# Patient Record
Sex: Female | Born: 1955
Health system: Southern US, Community
[De-identification: ages and names within clinical notes are randomized; demographics above are authoritative.]

## PROBLEM LIST (undated history)

## (undated) DIAGNOSIS — M797 Fibromyalgia: Secondary | ICD-10-CM

## (undated) DIAGNOSIS — K5790 Diverticulosis of intestine, part unspecified, without perforation or abscess without bleeding: Secondary | ICD-10-CM

## (undated) DIAGNOSIS — E2740 Unspecified adrenocortical insufficiency: Secondary | ICD-10-CM

## (undated) DIAGNOSIS — M48061 Spinal stenosis, lumbar region without neurogenic claudication: Secondary | ICD-10-CM

## (undated) DIAGNOSIS — I2510 Atherosclerotic heart disease of native coronary artery without angina pectoris: Secondary | ICD-10-CM

## (undated) DIAGNOSIS — M4800 Spinal stenosis, site unspecified: Secondary | ICD-10-CM

## (undated) DIAGNOSIS — M459 Ankylosing spondylitis of unspecified sites in spine: Secondary | ICD-10-CM

## (undated) DIAGNOSIS — K8020 Calculus of gallbladder without cholecystitis without obstruction: Secondary | ICD-10-CM

## (undated) DIAGNOSIS — R413 Other amnesia: Secondary | ICD-10-CM

## (undated) DIAGNOSIS — E11319 Type 2 diabetes mellitus with unspecified diabetic retinopathy without macular edema: Secondary | ICD-10-CM

## (undated) DIAGNOSIS — M810 Age-related osteoporosis without current pathological fracture: Secondary | ICD-10-CM

## (undated) DIAGNOSIS — G4730 Sleep apnea, unspecified: Secondary | ICD-10-CM

## (undated) DIAGNOSIS — E559 Vitamin D deficiency, unspecified: Secondary | ICD-10-CM

## (undated) DIAGNOSIS — E119 Type 2 diabetes mellitus without complications: Secondary | ICD-10-CM

## (undated) DIAGNOSIS — K219 Gastro-esophageal reflux disease without esophagitis: Secondary | ICD-10-CM

## (undated) DIAGNOSIS — N200 Calculus of kidney: Secondary | ICD-10-CM

## (undated) DIAGNOSIS — I493 Ventricular premature depolarization: Secondary | ICD-10-CM

## (undated) DIAGNOSIS — E041 Nontoxic single thyroid nodule: Secondary | ICD-10-CM

## (undated) HISTORY — DX: Sleep apnea, unspecified: G47.30

## (undated) HISTORY — DX: Calculus of kidney: N20.0

## (undated) HISTORY — DX: Diverticulosis of intestine, part unspecified, without perforation or abscess without bleeding: K57.90

## (undated) HISTORY — DX: Other amnesia: R41.3

## (undated) HISTORY — PX: LITHOTRIPSY: SUR834

## (undated) HISTORY — DX: Vitamin D deficiency, unspecified: E55.9

## (undated) HISTORY — DX: Age-related osteoporosis without current pathological fracture: M81.0

## (undated) HISTORY — DX: Unspecified adrenocortical insufficiency: E27.40

## (undated) HISTORY — DX: Spinal stenosis, site unspecified: M48.00

## (undated) HISTORY — DX: Atherosclerotic heart disease of native coronary artery without angina pectoris: I25.10

## (undated) HISTORY — DX: Fibromyalgia: M79.7

## (undated) HISTORY — PX: TUBAL LIGATION: SHX77

## (undated) HISTORY — DX: Ankylosing spondylitis of unspecified sites in spine: M45.9

## (undated) HISTORY — DX: Type 2 diabetes mellitus without complications: E11.9

## (undated) HISTORY — DX: Calculus of gallbladder without cholecystitis without obstruction: K80.20

## (undated) HISTORY — DX: Type 2 diabetes mellitus with unspecified diabetic retinopathy without macular edema: E11.319

## (undated) HISTORY — DX: Nontoxic single thyroid nodule: E04.1

## (undated) HISTORY — DX: Gastro-esophageal reflux disease without esophagitis: K21.9

## (undated) HISTORY — PX: TONSILLECTOMY: SUR1361

## (undated) HISTORY — DX: Spinal stenosis, lumbar region without neurogenic claudication: M48.061

## (undated) HISTORY — DX: Ventricular premature depolarization: I49.3

---

## 2000-07-04 HISTORY — PX: ABDOMINAL HYSTERECTOMY: SHX81

## 2013-01-05 ENCOUNTER — Other Ambulatory Visit (HOSPITAL_COMMUNITY)
Admission: RE | Admit: 2013-01-05 | Discharge: 2013-01-05 | Disposition: A | Discharge status: Home / Self Care | Payer: BC Managed Care – PPO | Source: Ambulatory Visit | Attending: Obstetrics and Gynecology | Admitting: Obstetrics and Gynecology

## 2013-01-05 DIAGNOSIS — Z01419 Encounter for gynecological examination (general) (routine) without abnormal findings: Secondary | ICD-10-CM | POA: Insufficient documentation

## 2013-01-05 DIAGNOSIS — Z1151 Encounter for screening for human papillomavirus (HPV): Secondary | ICD-10-CM | POA: Insufficient documentation

## 2013-02-06 ENCOUNTER — Other Ambulatory Visit: Payer: Self-pay | Admitting: Family Medicine

## 2013-02-06 DIAGNOSIS — N281 Cyst of kidney, acquired: Secondary | ICD-10-CM

## 2013-02-15 ENCOUNTER — Inpatient Hospital Stay: Admission: RE | Admit: 2013-02-15 | Payer: BC Managed Care – PPO | Source: Ambulatory Visit

## 2013-06-30 ENCOUNTER — Encounter: Payer: Self-pay | Admitting: *Deleted

## 2013-06-30 ENCOUNTER — Encounter: Payer: Self-pay | Admitting: Cardiology

## 2013-07-09 ENCOUNTER — Ambulatory Visit: Payer: BC Managed Care – PPO | Admitting: Cardiology

## 2014-01-28 DIAGNOSIS — F1120 Opioid dependence, uncomplicated: Secondary | ICD-10-CM | POA: Insufficient documentation

## 2015-09-04 HISTORY — PX: COLON SURGERY: SHX602

## 2017-09-16 ENCOUNTER — Ambulatory Visit (INDEPENDENT_AMBULATORY_CARE_PROVIDER_SITE_OTHER): Payer: BLUE CROSS/BLUE SHIELD | Admitting: Family Medicine

## 2017-09-16 ENCOUNTER — Telehealth: Payer: Self-pay | Admitting: Gastroenterology

## 2017-09-16 ENCOUNTER — Encounter: Payer: Self-pay | Admitting: Family Medicine

## 2017-09-16 VITALS — BP 144/86 | HR 91 | Temp 98.7°F | Ht 62.0 in | Wt 167.4 lb

## 2017-09-16 DIAGNOSIS — M48061 Spinal stenosis, lumbar region without neurogenic claudication: Secondary | ICD-10-CM

## 2017-09-16 DIAGNOSIS — M797 Fibromyalgia: Secondary | ICD-10-CM | POA: Diagnosis not present

## 2017-09-16 DIAGNOSIS — E785 Hyperlipidemia, unspecified: Secondary | ICD-10-CM | POA: Insufficient documentation

## 2017-09-16 DIAGNOSIS — M1990 Unspecified osteoarthritis, unspecified site: Secondary | ICD-10-CM | POA: Insufficient documentation

## 2017-09-16 DIAGNOSIS — Z800 Family history of malignant neoplasm of digestive organs: Secondary | ICD-10-CM

## 2017-09-16 DIAGNOSIS — M150 Primary generalized (osteo)arthritis: Secondary | ICD-10-CM

## 2017-09-16 DIAGNOSIS — K5903 Drug induced constipation: Secondary | ICD-10-CM | POA: Diagnosis not present

## 2017-09-16 DIAGNOSIS — M159 Polyosteoarthritis, unspecified: Secondary | ICD-10-CM

## 2017-09-16 DIAGNOSIS — E271 Primary adrenocortical insufficiency: Secondary | ICD-10-CM

## 2017-09-16 DIAGNOSIS — E7849 Other hyperlipidemia: Secondary | ICD-10-CM

## 2017-09-16 DIAGNOSIS — IMO0002 Reserved for concepts with insufficient information to code with codable children: Secondary | ICD-10-CM | POA: Insufficient documentation

## 2017-09-16 DIAGNOSIS — M810 Age-related osteoporosis without current pathological fracture: Secondary | ICD-10-CM

## 2017-09-16 DIAGNOSIS — I2510 Atherosclerotic heart disease of native coronary artery without angina pectoris: Secondary | ICD-10-CM

## 2017-09-16 DIAGNOSIS — K8020 Calculus of gallbladder without cholecystitis without obstruction: Secondary | ICD-10-CM | POA: Insufficient documentation

## 2017-09-16 DIAGNOSIS — E118 Type 2 diabetes mellitus with unspecified complications: Secondary | ICD-10-CM

## 2017-09-16 DIAGNOSIS — E119 Type 2 diabetes mellitus without complications: Secondary | ICD-10-CM | POA: Diagnosis not present

## 2017-09-16 DIAGNOSIS — K629 Disease of anus and rectum, unspecified: Secondary | ICD-10-CM | POA: Diagnosis not present

## 2017-09-16 DIAGNOSIS — Z780 Asymptomatic menopausal state: Secondary | ICD-10-CM

## 2017-09-16 DIAGNOSIS — K760 Fatty (change of) liver, not elsewhere classified: Secondary | ICD-10-CM

## 2017-09-16 DIAGNOSIS — M459 Ankylosing spondylitis of unspecified sites in spine: Secondary | ICD-10-CM | POA: Diagnosis not present

## 2017-09-16 DIAGNOSIS — R413 Other amnesia: Secondary | ICD-10-CM

## 2017-09-16 DIAGNOSIS — K6289 Other specified diseases of anus and rectum: Secondary | ICD-10-CM | POA: Insufficient documentation

## 2017-09-16 DIAGNOSIS — K219 Gastro-esophageal reflux disease without esophagitis: Secondary | ICD-10-CM | POA: Insufficient documentation

## 2017-09-16 DIAGNOSIS — N200 Calculus of kidney: Secondary | ICD-10-CM | POA: Insufficient documentation

## 2017-09-16 DIAGNOSIS — M8580 Other specified disorders of bone density and structure, unspecified site: Secondary | ICD-10-CM

## 2017-09-16 DIAGNOSIS — R0789 Other chest pain: Secondary | ICD-10-CM | POA: Insufficient documentation

## 2017-09-16 DIAGNOSIS — G4733 Obstructive sleep apnea (adult) (pediatric): Secondary | ICD-10-CM | POA: Insufficient documentation

## 2017-09-16 DIAGNOSIS — T402X5A Adverse effect of other opioids, initial encounter: Secondary | ICD-10-CM

## 2017-09-16 DIAGNOSIS — E1165 Type 2 diabetes mellitus with hyperglycemia: Secondary | ICD-10-CM | POA: Insufficient documentation

## 2017-09-16 DIAGNOSIS — L438 Other lichen planus: Secondary | ICD-10-CM | POA: Insufficient documentation

## 2017-09-16 HISTORY — DX: Atherosclerotic heart disease of native coronary artery without angina pectoris: I25.10

## 2017-09-16 HISTORY — DX: Other specified disorders of bone density and structure, unspecified site: M85.80

## 2017-09-16 HISTORY — DX: Other amnesia: R41.3

## 2017-09-16 HISTORY — DX: Ankylosing spondylitis of unspecified sites in spine: M45.9

## 2017-09-16 HISTORY — DX: Spinal stenosis, lumbar region without neurogenic claudication: M48.061

## 2017-09-16 HISTORY — DX: Asymptomatic menopausal state: Z78.0

## 2017-09-16 LAB — CBC WITH DIFFERENTIAL/PLATELET
Basophils Absolute: 0.1 10*3/uL (ref 0.0–0.1)
Basophils Relative: 0.6 % (ref 0.0–3.0)
EOS ABS: 0.2 10*3/uL (ref 0.0–0.7)
Eosinophils Relative: 1.5 % (ref 0.0–5.0)
HCT: 44.2 % (ref 36.0–46.0)
HEMOGLOBIN: 14.4 g/dL (ref 12.0–15.0)
LYMPHS ABS: 3.2 10*3/uL (ref 0.7–4.0)
Lymphocytes Relative: 27.9 % (ref 12.0–46.0)
MCHC: 32.5 g/dL (ref 30.0–36.0)
MCV: 82.3 fl (ref 78.0–100.0)
MONO ABS: 0.7 10*3/uL (ref 0.1–1.0)
Monocytes Relative: 6.5 % (ref 3.0–12.0)
NEUTROS PCT: 63.5 % (ref 43.0–77.0)
Neutro Abs: 7.2 10*3/uL (ref 1.4–7.7)
Platelets: 274 10*3/uL (ref 150.0–400.0)
RBC: 5.37 Mil/uL — AB (ref 3.87–5.11)
RDW: 14.4 % (ref 11.5–15.5)
WBC: 11.3 10*3/uL — AB (ref 4.0–10.5)

## 2017-09-16 LAB — LIPID PANEL
CHOL/HDL RATIO: 5
CHOLESTEROL: 241 mg/dL — AB (ref 0–200)
HDL: 53.1 mg/dL (ref >39.00)
NONHDL: 187.68
Triglycerides: 270 mg/dL — ABNORMAL HIGH (ref 0.0–149.0)
VLDL: 54 mg/dL — ABNORMAL HIGH (ref 0.0–40.0)

## 2017-09-16 LAB — COMPREHENSIVE METABOLIC PANEL
ALBUMIN: 4.2 g/dL (ref 3.5–5.2)
ALT: 17 U/L (ref 0–35)
AST: 22 U/L (ref 0–37)
Alkaline Phosphatase: 74 U/L (ref 39–117)
BUN: 11 mg/dL (ref 6–23)
CHLORIDE: 102 meq/L (ref 96–112)
CO2: 29 meq/L (ref 19–32)
CREATININE: 0.76 mg/dL (ref 0.40–1.20)
Calcium: 9.5 mg/dL (ref 8.4–10.5)
GFR: 81.62 mL/min (ref >60.00)
GLUCOSE: 133 mg/dL — AB (ref 70–99)
POTASSIUM: 4.2 meq/L (ref 3.5–5.1)
SODIUM: 139 meq/L (ref 135–145)
Total Bilirubin: 0.5 mg/dL (ref 0.2–1.2)
Total Protein: 7.2 g/dL (ref 6.0–8.3)

## 2017-09-16 LAB — TSH: TSH: 4.65 u[IU]/mL — ABNORMAL HIGH (ref 0.35–4.50)

## 2017-09-16 LAB — LDL CHOLESTEROL, DIRECT: Direct LDL: 181 mg/dL

## 2017-09-16 LAB — HEMOGLOBIN A1C: HEMOGLOBIN A1C: 7.6 % — AB (ref 4.6–6.5)

## 2017-09-16 NOTE — Progress Notes (Signed)
Subjective  CC:  Chief Complaint  Patient presents with  . Establish Care    moved frrom  3 months ago. has multiple chronic medical problems. no records available for review  . Coronary Artery Disease  . Diabetes  . Hypertension  . Osteoarthritis    HPI: Christine Guzman is a 61 y.o. female who presents to [facility] at [facility] today to establish care with me as a new patient.  She has the following concerns or needs:   This is a 61 year old female who moved here from [facility] recently to be closer to her daughter and son who live in [place].  She has an extremely complicated past medical history.  I do not have medical records to review and have asked for them.  We discussed and documented the following:  Chronic pain due to fibromyalgia, ankylosing spondylitis, spinal stenosis, fibromyalgia requiring chronic opioid use.  She has chronic opioid-induced constipation.  She needs a referral to a pain specialist to manage this here.  She reports that she has been seen by rheumatology and spine specialist in the past.  She is chronic pain and an unsteady gait.  She walks with a cane.  History of diabetes, hypertension, strong family history of coronary artery disease, CAD herself, hyperlipidemia.  She is a vague historian.  Reports her diabetes was most recently managed with metformin although she had been on multiple medications in the past.  Her last A1c was over 1 year ago.  She is not currently on medications for diabetes.  She denies symptoms of hyperglycemia.  Family history is complicated as well, multiple family members with coronary artery disease, death of a daughter at age 81.  Multiple cancers, diabetes, hyperlipidemia.  Family history of colon cancer status post multiple colonoscopies.  She reports that she had a rectal mass removed from her rectum in 2016.  She cannot tell me what the diagnosis was however it seems that it was benign.  She was  recommended to have a follow-up colonoscopy but has not had this yet.  She has had multiple colonoscopies in the past due to her family history.  She has chronic constipation now but no longer with rectal pain or melena.  No weight loss or chills.  Please refer to problem list below: We discussed each problem in detail.    We updated and reviewed the patient's past history in detail and it is documented below.  Patient Active Problem List   Diagnosis Date Noted  . Spinal stenosis, lumbar 09/16/2017  . Ankylosing spondylitis (HCC) 09/16/2017  . Osteoarthritis 09/16/2017  . Erosive lichen planus of vulva 09/16/2017  . Rectal mass 09/16/2017  . Therapeutic opioid induced constipation 09/16/2017  . Osteopenia after menopause 09/16/2017  . Memory impairment 09/16/2017  . Diabetes mellitus without complication (HCC) 09/16/2017  . Fibromyalgia 09/16/2017  . GERD (gastroesophageal reflux disease) 09/16/2017  . Nephrolithiasis 09/16/2017  . Fatty liver 09/16/2017  . Familial hyperlipidemia 09/16/2017  . Gallstone 09/16/2017  . OSA (obstructive sleep apnea) 09/16/2017  . CAD (coronary artery disease), native coronary artery 09/16/2017  . Chronic narcotic dependence (HCC) 01/28/2014     Health Maintenance  Topic Date Due  . HEMOGLOBIN A1C  [birth year]  . Hepatitis C Screening  03-Mar-1956  . PNEUMOCOCCAL POLYSACCHARIDE VACCINE (1) 06/09/1956  . FOOT EXAM  06/09/1964  . OPHTHALMOLOGY EXAM  06/09/1964  . URINE MICROALBUMIN  06/09/1964  . HIV Screening  06/09/1969  . TETANUS/TDAP  06/09/1973  . MAMMOGRAM  06/09/2004  . COLONOSCOPY  06/09/2004  . INFLUENZA VACCINE  09/16/2018 (Originally 05/04/2017)    There is no immunization history on file for this patient. Current Meds  Medication Sig  . aspirin 81 MG tablet Take 81 mg by mouth daily.  . clobetasol cream (TEMOVATE) 0.05 % Apply 1 application topically 2 (two) times daily.  Marland Kitchen. esomeprazole (NEXIUM) 40 MG capsule Take 40 mg by mouth  daily before breakfast.  . estradiol (ESTRACE) 0.1 MG/GM vaginal cream Place vaginally.  . magnesium citrate ([facility] CITRATE OF MAGNESIA) SOLN Take by mouth.  . metFORMIN (GLUCOPHAGE) 500 MG tablet Take 500 mg by mouth 2 (two) times daily with a meal.  . morphine (KADIAN) 60 MG 24 hr capsule TAKE ONE CAPSULE BY MOUTH EVERY 12 HOURS FOR SEVERE SPIINE PAIN  . Vitamin D, Ergocalciferol, (DRISDOL) 50000 UNITS CAPS capsule Take 50,000 Units by mouth 2 (two) times a week.   . [DISCONTINUED] methylnaltrexone (RELISTOR) 12 MG/0.6ML SOLN injection Inject every other day    Allergies: Patient is allergic to amoxicillin and sulfa antibiotics. Past Medical History Patient  has a past medical history of Adrenal insufficiency (HCC), Ankylosing spondylitis (HCC) (09/16/2017), CAD (coronary artery disease), native coronary artery (09/16/2017), Diabetes (HCC), Diverticulosis, Fibromyalgia, Gallstone, GERD (gastroesophageal reflux disease), Kidney stone, Memory impairment (09/16/2017), Osteopenia after menopause (09/16/2017), PVC (premature ventricular contraction), Sleep apnea, Spinal stenosis, Spinal stenosis, lumbar (09/16/2017), Thyroid nodule, and Vitamin D deficiency. Past Surgical History Patient  has a past surgical history that includes Tonsillectomy; Lithotripsy; Tubal ligation; Abdominal hysterectomy (07/2000); Cesarean section; and Colon surgery (09/2015). Family History: Patient family history includes Cirrhosis in her son; Diabetes in her daughter and mother; Heart disease in her brother and daughter; Hypertension in her son; Liver cancer in her mother; Lung cancer in her father; Nephrolithiasis in her daughter; Polycystic ovary syndrome in her daughter and daughter; Stroke in her brother and son. Social History:  Patient  reports that  has never smoked. she has never used smokeless tobacco. She reports that she does not drink alcohol or use drugs.  Review of Systems: Constitutional: negative for fever  or malaise Ophthalmic: negative for photophobia, double vision or loss of vision Cardiovascular: negative for chest pain, dyspnea on exertion, or new LE swelling Respiratory: negative for SOB or persistent cough, positive history of recurrent pneumonia, collapsed lung on the right. Gastrointestinal: negative for abdominal pain, change in bowel habits or melena Genitourinary: negative for dysuria or gross hematuria Musculoskeletal: negative for new gait disturbance or muscular weakness, positive for chronic gait instability Integumentary: negative for new or persistent rashes Neurological: negative for TIA or stroke symptoms, reports history of early dementia Psychiatric: negative for SI or delusions Allergic/Immunologic: negative for hives  Patient Care Team    Relationship Specialty Notifications Start End  Willow OraAndy, Averil Digman L, MD PCP - General Family Medicine  09/16/17     Objective  Vitals: BP (!) 144/86 (BP Location: Left Arm, Patient Position: Sitting, Cuff Size: Normal)   Pulse 91   Temp 98.7 F (37.1 C) (Oral)   Ht 5\' 2"  (1.575 m)   Wt 167 lb 6.1 oz (75.9 kg)   SpO2 95%   BMI 30.61 kg/m  General:  Well developed, well nourished, no acute distress  Psych:  Alert and oriented,normal mood and affect HEENT:  Normocephalic, atraumatic, non-icteric sclera, PERRL, oropharynx is without mass or exudate, supple neck without adenopathy, mass or thyromegaly Cardiovascular:  RRR without gallop, rub or murmur, nondisplaced PMI Respiratory:  Good breath sounds bilaterally,  CTAB with normal respiratory effort Gastrointestinal: normal bowel sounds, soft, non-tender MSK: no deformities, contusions. Joints are without erythema or swelling Skin:  Warm, no rashes or suspicious lesions noted Neurologic:    Mental status is normal. Gross motor and sensory exams are normal.  Assessment  1. Spinal stenosis of lumbar region without neurogenic claudication   2. Ankylosing spondylitis, unspecified  site of spine (HCC)   3. Primary osteoarthritis involving multiple joints   4. Rectal mass   5. Therapeutic opioid induced constipation   6. Diabetes mellitus without complication (HCC)   7. Fibromyalgia   8. Fatty liver   9. Familial hyperlipidemia   10. Coronary artery disease involving native coronary artery of native heart without angina pectoris   11. Family history of colon cancer      Plan   Complicated case: Today we will start by ordering lab work to assess diabetic control, renal function, hyperlipidemia.  Will make recommendations on medication management once results are back.  Today blood pressure remains mildly elevated.  Request records to get a better understanding of her past GI history and diagnosis for rectal mass.  Referred to gastroenterology for follow-up colonoscopy.  Chronic pain referral to manage chronic narcotic dependence.  Request records from cardiology to better understand her heart disease.  Referral to cardiology made today.  Close follow-up at monthly intervals until chronic problems are well controlled.  Today's visit was 45 minutes long. Greater than 50% of this time was devoted to face to face counseling with the patient and coordination of care. We discussed her diagnosis, prognosis, treatment options and developed a follow up plan as documented above.    Follow up:  Return in about 4 weeks (around 10/14/2017) for follow up on medical problems, 30 minutes.  Commons side effects, risks, benefits, and alternatives for medications and treatment plan prescribed today were discussed, and the patient expressed understanding of the given instructions. Patient is instructed to call or message via MyChart if he/she has any questions or concerns regarding our treatment plan. No barriers to understanding were identified. We discussed Red Flag symptoms and signs in detail. Patient expressed understanding regarding what to do in case of urgent or emergency type  symptoms.   Medication list was reconciled, printed and provided to the patient in AVS. Patient instructions and summary information was reviewed with the patient as documented in the AVS. This note was prepared with assistance of Dragon voice recognition software. Occasional wrong-word or sound-a-like substitutions may have occurred due to the inherent limitations of voice recognition software  Orders Placed This Encounter  Procedures  . CBC with Differential/Platelet  . Comprehensive metabolic panel  . Lipid panel  . Hemoglobin A1c  . TSH  . Hepatitis C antibody  . Ambulatory referral to Gastroenterology  . Ambulatory referral to Pain Clinic  . Ambulatory referral to Cardiology   No orders of the defined types were placed in this encounter.

## 2017-09-16 NOTE — Patient Instructions (Signed)
Please return in 4 weeks for a follow up appointment. 30 minutes please.  It was a pleasure meeting you! Thank you for choosing us to meet your healthcare needs! I truly look forward to working with you.  I will release your lab results to you on your MyChart account with further instructions. Please reply with any questions.   We will call you with information regarding your referral appointment. Pain management, Gastroenterology and Cardiology.

## 2017-09-16 NOTE — Telephone Encounter (Signed)
Okay to schedule as directed by Dr Lavon PaganiniNandigam. Thank you

## 2017-09-16 NOTE — Telephone Encounter (Signed)
Ok to schedule with APP and colonoscopy with any provider that has availability for a procedure soon. Thanks

## 2017-09-16 NOTE — Telephone Encounter (Signed)
There are not any GI records. But she has a verbal history of a rectal mass. Okay to schedule her with an APP. How soon does she need to be seen.

## 2017-09-16 NOTE — Telephone Encounter (Signed)
No answer, mailbox full. Will try again later.

## 2017-09-17 LAB — HEPATITIS C ANTIBODY
Hepatitis C Ab: NONREACTIVE
SIGNAL TO CUT-OFF: 0.09 (ref <1.00)

## 2017-09-19 ENCOUNTER — Other Ambulatory Visit: Payer: Self-pay | Admitting: *Deleted

## 2017-09-19 ENCOUNTER — Encounter: Payer: Self-pay | Admitting: Family Medicine

## 2017-09-19 ENCOUNTER — Telehealth: Payer: Self-pay | Admitting: *Deleted

## 2017-09-19 ENCOUNTER — Encounter: Payer: Self-pay | Admitting: Physician Assistant

## 2017-09-19 MED ORDER — ESOMEPRAZOLE MAGNESIUM 40 MG PO CPDR: 40.0000 mg | DELAYED_RELEASE_CAPSULE | Freq: Every day | ORAL | 1 refills | Status: DC

## 2017-09-19 MED ORDER — CLOBETASOL PROPIONATE 0.05 % EX CREA: 1.0000 "application " | TOPICAL_CREAM | Freq: Two times a day (BID) | CUTANEOUS | 0 refills | Status: DC

## 2017-09-19 MED ORDER — ESTRADIOL 0.1 MG/GM VA CREA: 1.0000 | TOPICAL_CREAM | VAGINAL | 1 refills | Status: DC

## 2017-09-19 MED ORDER — METFORMIN HCL 500 MG PO TABS: 500.0000 mg | ORAL_TABLET | Freq: Two times a day (BID) | ORAL | 1 refills | Status: DC

## 2017-09-19 NOTE — Telephone Encounter (Signed)
Dr. Mardelle MatteAndy, while speaking with pt about lab results she said she needs refill on Hydrocortisone 5 mg, 2 in AM and 2 in PM for Adrenal disease. Is not on medication list.

## 2017-09-20 ENCOUNTER — Other Ambulatory Visit (INDEPENDENT_AMBULATORY_CARE_PROVIDER_SITE_OTHER): Payer: BLUE CROSS/BLUE SHIELD

## 2017-09-20 DIAGNOSIS — E7849 Other hyperlipidemia: Secondary | ICD-10-CM | POA: Diagnosis not present

## 2017-09-20 LAB — T4, FREE: Free T4: 0.78 ng/dL (ref 0.60–1.60)

## 2017-09-20 MED ORDER — HYDROCORTISONE 5 MG PO TABS: ORAL_TABLET | ORAL | 3 refills | Status: DC

## 2017-09-20 MED ORDER — METFORMIN HCL ER 500 MG PO TB24: 1000.0000 mg | ORAL_TABLET | Freq: Every day | ORAL | 3 refills | Status: DC

## 2017-09-20 NOTE — Addendum Note (Signed)
Addended by: Asencion PartridgeANDY, CAMILLE on: 09/20/2017 03:52 PM   Modules accepted: Orders

## 2017-09-22 ENCOUNTER — Telehealth: Payer: Self-pay | Admitting: *Deleted

## 2017-09-22 MED ORDER — ONETOUCH VERIO W/DEVICE KIT: 1.0000 | PACK | Freq: Two times a day (BID) | 0 refills | Status: AC | PRN

## 2017-09-22 MED ORDER — GLUCOSE BLOOD VI STRP: ORAL_STRIP | 12 refills | Status: DC

## 2017-09-22 MED ORDER — ONETOUCH DELICA LANCETS 33G MISC: 2 refills | Status: AC

## 2017-09-22 NOTE — Telephone Encounter (Signed)
Spoke to Christine Guzman, told her Dr. Mardelle MatteAndy wants her to check sugars twice a day, once in the AM before breakfast and in the pm. Christine Guzman verbalized understanding. Asked Christine Guzman if she knew what monitor is covered by insurance? Christine Guzman said no, but her ENDO said One Touch. Told Christine Guzman okay I will order One Touch meter and test strips and send to pharmacy. Christine Guzman verbalized understanding.

## 2017-09-23 ENCOUNTER — Encounter: Payer: Self-pay | Admitting: Physician Assistant

## 2017-09-23 ENCOUNTER — Ambulatory Visit (INDEPENDENT_AMBULATORY_CARE_PROVIDER_SITE_OTHER): Payer: BLUE CROSS/BLUE SHIELD | Admitting: Physician Assistant

## 2017-09-23 VITALS — BP 118/78 | HR 71 | Ht 62.0 in | Wt 164.0 lb

## 2017-09-23 DIAGNOSIS — K5909 Other constipation: Secondary | ICD-10-CM

## 2017-09-23 DIAGNOSIS — K219 Gastro-esophageal reflux disease without esophagitis: Secondary | ICD-10-CM | POA: Diagnosis not present

## 2017-09-23 DIAGNOSIS — Z1211 Encounter for screening for malignant neoplasm of colon: Secondary | ICD-10-CM

## 2017-09-23 DIAGNOSIS — Z800 Family history of malignant neoplasm of digestive organs: Secondary | ICD-10-CM | POA: Diagnosis not present

## 2017-09-23 DIAGNOSIS — Z8719 Personal history of other diseases of the digestive system: Secondary | ICD-10-CM

## 2017-09-23 MED ORDER — ESOMEPRAZOLE MAGNESIUM 40 MG PO CPDR
40.0000 mg | DELAYED_RELEASE_CAPSULE | Freq: Two times a day (BID) | ORAL | 3 refills | Status: DC
Start: 2017-09-23 — End: 2018-06-12

## 2017-09-23 NOTE — Progress Notes (Signed)
Agree with assessment and plan as outlined.  

## 2017-09-23 NOTE — Progress Notes (Signed)
Subjective:    Patient ID: Christine Guzman, female    DOB: [birth year], 61 y.o.   MRN: [id number]  HPI Christine Guzman is a 61 year old white female, new to GI today referred by Dr. Billey Chang for consideration of colonoscopy and review of her complicated GI history.  Patient has just relocated to [place].  She had previously been living in [facility] [place] [facility] and then in the [facility] area.  She has history of coronary artery disease, sleep apnea, opioid-induced constipation.  She is on chronic morphine for chronic back pain, ankylosing spondylitis, and spinal stenosis.  Also with fibromyalgia, adult onset diabetes mellitus and fatty liver disease. She says she had a gastroenterologist in [place] that she saw for about 20 years.  She has had prior endoscopy and colonoscopy there. She has a nephew who apparently was diagnosed with colon cancer at age 44, no other family members with colon cancer that she is aware of. She had a lot of difficulty with anal /rectal pain in 2016.  She says she has a very hard time getting a diagnosis that was having a hard time sitting.  And having a very hard time having bowel movements.  She was originally seen a Psychologist, sport and exercise affiliated with [facility].  I can see that note in her records, and anoscopy was done without a definite lesion seen, she was noted to have internal hemorrhoids and some friability and was to be referred for colonoscopy. She eventually was evaluated in [facility] had a CT scan done which showed an 8 x 8 cm calcified mass at the rectosigmoid junction.  We did obtain copies of those records.  Flexible sigmoidoscopy was done on 09/07/2015 which showed an obstructing fecal mass in the proximal rectum approximately 10 cm from the dentate line.  A significant amount of time was spent trying to dislodge this with the no success. She did have an exam under anesthesia by a surgeon and had manual removal of the large fecal impaction. Patient says she has been tried  on several medications for chronic constipation without success and is now using 10 ounces of mag citrate daily which works very well.  She says she has bowel movements daily without any straining or constipation.  She has not been having any recent bleeding. Her last colonoscopy was done in 2013 apparently had a lipoma present in the proximal transverse colon and noted diverticulosis, there were no polyps.  She also has history of chronic GERD.  She has been on twice daily Nexium long-term.  She says she has tried to cut back to once daily with immediate exacerbation of symptoms.  She has no current complaints of dysphagia or odynophagia and the Nexium is controlling her symptoms.  She has had prior EGD but does not remember the date.  Review of Systems;Pertinent positive and negative review of systems were noted in the above HPI section.  All other review of systems was otherwise negative.  Outpatient Encounter Medications as of 09/23/2017  Medication Sig  . aspirin 81 MG tablet Take 81 mg by mouth daily.  . Blood Glucose Monitoring Suppl (ONETOUCH VERIO) w/Device KIT 1 kit by Does not apply route 2 (two) times daily as needed.  . clobetasol cream (TEMOVATE) 8.65 % Apply 1 application topically 2 (two) times daily.  Marland Kitchen esomeprazole (NEXIUM) 40 MG capsule Take 1 capsule (40 mg total) by mouth 2 (two) times daily before a meal.  . estradiol (ESTRACE) 0.1 MG/GM vaginal cream Place 1 Applicatorful vaginally 3 (  three) times a week.  Marland Kitchen glucose blood (ONETOUCH VERIO) test strip USE TO CHECK BLOOD SUGARS TWICE A DAY AND PRN  . hydrocortisone (CORTEF) 5 MG tablet Take 2 tablets (10 mg total) by mouth every morning AND 1 tablet (5 mg total) at bedtime.  . magnesium citrate ([facility]S CITRATE OF MAGNESIA) SOLN Take by mouth.  . metFORMIN (GLUCOPHAGE-XR) 500 MG 24 hr tablet Take 2 tablets (1,000 mg total) by mouth daily with breakfast.  . morphine (KADIAN) 60 MG 24 hr capsule TAKE ONE CAPSULE BY MOUTH EVERY 12  HOURS FOR SEVERE SPIINE PAIN  . ONETOUCH DELICA LANCETS 94B MISC USE TO CHECK BLOOD SUGAR TWICE A DAY AND PRN  . Vitamin D, Ergocalciferol, (DRISDOL) 50000 UNITS CAPS capsule Take 50,000 Units by mouth 2 (two) times a week.   . [DISCONTINUED] esomeprazole (NEXIUM) 40 MG capsule Take 1 capsule (40 mg total) by mouth daily before breakfast.   No facility-administered encounter medications on file as of 09/23/2017.    Allergies  Allergen Reactions  . Amoxicillin Rash  . Sulfa Antibiotics Rash   Patient Active Problem List   Diagnosis Date Noted  . Spinal stenosis, lumbar 09/16/2017  . Ankylosing spondylitis ([place]) 09/16/2017  . Osteoarthritis 09/16/2017  . Erosive lichen planus of vulva 09/16/2017  . Rectal mass 09/16/2017  . Therapeutic opioid induced constipation 09/16/2017  . Osteopenia after menopause 09/16/2017  . Memory impairment 09/16/2017  . Diabetes mellitus without complication ([place]) 09/62/8366  . Fibromyalgia 09/16/2017  . GERD (gastroesophageal reflux disease) 09/16/2017  . Nephrolithiasis 09/16/2017  . Fatty liver 09/16/2017  . Familial hyperlipidemia 09/16/2017  . Gallstone 09/16/2017  . OSA (obstructive sleep apnea) 09/16/2017  . CAD (coronary artery disease), native coronary artery 09/16/2017  . Chronic narcotic dependence ([place]) 01/28/2014   Social History   Socioeconomic History  . Marital status: Married    Spouse name: Delilah Shan  . Number of children: 4  . Years of education: Not on file  . Highest education level: Not on file  Social Needs  . Financial resource strain: Hard  . Food insecurity - worry: Not on file  . Food insecurity - inability: Not on file  . Transportation needs - medical: Not on file  . Transportation needs - non-medical: Not on file  Occupational History  . Occupation: disabled, pain  Tobacco Use  . Smoking status: Never Smoker  . Smokeless tobacco: Never Used  Substance and Sexual Activity  . Alcohol use: No    Frequency: Never   . Drug use: No  . Sexual activity: No  Other Topics Concern  . Not on file  Social History Narrative  . Not on file    Christine Guzman's family history includes Cirrhosis in her son; Diabetes in her daughter and mother; Heart disease in her brother and daughter; Hypertension in her son; Liver cancer in her mother; Lung cancer in her father; Nephrolithiasis in her daughter; Polycystic ovary syndrome in her daughter and daughter; Stroke in her brother and son.      Objective:    Vitals:   09/23/17 1336  BP: 118/78  Pulse: 71    Physical Exam well-developed older white female in no acute distress, blood pressure 118/78, pulse 71, height 5 foot 2, weight 164, BMI of 30.  HEENT; nontraumatic normocephalic EOMI PERRLA sclera anicteric, Cardiovascular ;regular rate and rhythm with S1-S2 no murmur rub or gallop, Pulmonary ;clear bilaterally, Abdomen ;soft, basically nontender bowel sounds are present she has a low midline incisional scar, no  palpable mass or hepatosplenomegaly, Rectal ;exam not done, Extremities ;no clubbing cyanosis or edema skin warm and dry, Neuro psych; mood and affect appropriate       Assessment & Plan:   #50 61 year old white female with chronic severe constipation lifelong by her report but now narcotic dependent on chronic morphine.  She had a severe fecal impaction in 2016 which required surgical intervention for removal.  She has been doing well on 10 ounces of mag citrate daily for management of her constipation.  She states she tried Amitiza and Linzess in the past without any benefit. #2 family history of colon cancer in a maternal nephew #3 diverticulosis  #4.  Chronic back pain, narcotic dependent, history of final stenosis and ankylosing spondylitis #5.  Fibromyalgia #6.  Chronic GERD #7.  Fatty liver #8.  Adult onset diabetes mellitus #9.  Coronary artery disease #10 history of adrenal insufficiency  Plan; patient will continue mag citrate 10 ounces  daily Patient will be scheduled for colonoscopy with Dr. Havery Moros.  She was advised to have colonoscopy in 2016 which was never done. Procedure was discussed in detail with patient including indications risks and benefits and she is agreeable to proceed. We will give her a prolonged bowel prep.  She is asked to take a whole bottle of mag citrate on the day prior to the colonoscopy and prior to starting regular bowel prep. Will increase her chronic steroids around the time of sedation.  She will double up on her dosage for 24 hours prior to colonoscopy.  Prescription was sent for Nexium 40 mg p.o. twice daily, #180 with 3 refills Patient signed a release and we will obtain her prior records from gastroenterologist in [place].   Amy S Esterwood PA-C 09/23/2017   Cc: Leamon Arnt, MD

## 2017-09-23 NOTE — Patient Instructions (Addendum)
You have been scheduled for a colonoscopy. Please follow written instructions given to you at your visit today.  Please pick up your prep supplies at the pharmacy within the next 1-3 days. [facility] [address]  If you use inhalers (even only as needed), please bring them with you on the day of your procedure.   Take 20 mg Cortef in the morning, day before colonoscopy. Take 10 mg the night before the colonoscopy and 20 mg the morning of the procedure.  IIf you are age 63 or younger, your body mass index should be between 19-25. Your Body mass index is 30 kg/m. If this is out of the aformentioned range listed, please consider follow up with your Primary Care Provider.

## 2017-09-26 ENCOUNTER — Encounter: Payer: Self-pay | Admitting: Family Medicine

## 2017-09-26 DIAGNOSIS — E559 Vitamin D deficiency, unspecified: Secondary | ICD-10-CM | POA: Insufficient documentation

## 2017-09-26 DIAGNOSIS — E11319 Type 2 diabetes mellitus with unspecified diabetic retinopathy without macular edema: Secondary | ICD-10-CM

## 2017-09-26 DIAGNOSIS — E042 Nontoxic multinodular goiter: Secondary | ICD-10-CM | POA: Insufficient documentation

## 2017-09-26 HISTORY — DX: Type 2 diabetes mellitus with unspecified diabetic retinopathy without macular edema: E11.319

## 2017-10-10 ENCOUNTER — Telehealth: Payer: Self-pay | Admitting: Gastroenterology

## 2017-10-10 ENCOUNTER — Ambulatory Visit (INDEPENDENT_AMBULATORY_CARE_PROVIDER_SITE_OTHER): Payer: BLUE CROSS/BLUE SHIELD | Admitting: Family Medicine

## 2017-10-10 ENCOUNTER — Encounter: Payer: Self-pay | Admitting: Family Medicine

## 2017-10-10 VITALS — BP 120/70 | HR 70 | Temp 98.1°F | Ht 62.0 in | Wt 166.0 lb

## 2017-10-10 DIAGNOSIS — E7849 Other hyperlipidemia: Secondary | ICD-10-CM | POA: Diagnosis not present

## 2017-10-10 DIAGNOSIS — L304 Erythema intertrigo: Secondary | ICD-10-CM | POA: Diagnosis not present

## 2017-10-10 DIAGNOSIS — K6289 Other specified diseases of anus and rectum: Secondary | ICD-10-CM

## 2017-10-10 DIAGNOSIS — K629 Disease of anus and rectum, unspecified: Secondary | ICD-10-CM | POA: Diagnosis not present

## 2017-10-10 DIAGNOSIS — E271 Primary adrenocortical insufficiency: Secondary | ICD-10-CM | POA: Diagnosis not present

## 2017-10-10 DIAGNOSIS — K5903 Drug induced constipation: Secondary | ICD-10-CM | POA: Diagnosis not present

## 2017-10-10 DIAGNOSIS — IMO0002 Reserved for concepts with insufficient information to code with codable children: Secondary | ICD-10-CM

## 2017-10-10 DIAGNOSIS — Z23 Encounter for immunization: Secondary | ICD-10-CM

## 2017-10-10 DIAGNOSIS — Z1231 Encounter for screening mammogram for malignant neoplasm of breast: Secondary | ICD-10-CM | POA: Diagnosis not present

## 2017-10-10 DIAGNOSIS — E559 Vitamin D deficiency, unspecified: Secondary | ICD-10-CM

## 2017-10-10 DIAGNOSIS — E118 Type 2 diabetes mellitus with unspecified complications: Secondary | ICD-10-CM | POA: Diagnosis not present

## 2017-10-10 DIAGNOSIS — T402X5A Adverse effect of other opioids, initial encounter: Secondary | ICD-10-CM | POA: Diagnosis not present

## 2017-10-10 DIAGNOSIS — E1165 Type 2 diabetes mellitus with hyperglycemia: Secondary | ICD-10-CM

## 2017-10-10 DIAGNOSIS — E042 Nontoxic multinodular goiter: Secondary | ICD-10-CM | POA: Diagnosis not present

## 2017-10-10 DIAGNOSIS — Z1239 Encounter for other screening for malignant neoplasm of breast: Secondary | ICD-10-CM

## 2017-10-10 LAB — VITAMIN D 25 HYDROXY (VIT D DEFICIENCY, FRACTURES): VITD: 82.68 ng/mL (ref 30.00–100.00)

## 2017-10-10 LAB — MICROALBUMIN / CREATININE URINE RATIO
CREATININE, U: 182.1 mg/dL
MICROALB UR: 1.1 mg/dL (ref 0.0–1.9)
Microalb Creat Ratio: 0.6 mg/g (ref 0.0–30.0)

## 2017-10-10 MED ORDER — ROSUVASTATIN CALCIUM 20 MG PO TABS: 20.0000 mg | ORAL_TABLET | Freq: Every day | ORAL | 3 refills | Status: DC

## 2017-10-10 MED ORDER — KETOCONAZOLE 2 % EX CREA: 1.0000 "application " | TOPICAL_CREAM | Freq: Every day | CUTANEOUS | 0 refills | Status: DC

## 2017-10-10 MED ORDER — NYSTATIN 100000 UNIT/GM EX POWD: Freq: Two times a day (BID) | CUTANEOUS | 0 refills | Status: DC

## 2017-10-10 NOTE — Patient Instructions (Signed)
Please return in 6-8 weeks to recheck your diabetes. Please call to make an appointment for your eye exam: [facility]  If you have any questions or concerns, please don't hesitate to send me a message via MyChart or call the office at [phone]. Thank you for visiting with [place] today! It's our pleasure caring for you.   Type 2 Diabetes Mellitus, Self Care, Adult When you have type 2 diabetes (type 2 diabetes mellitus), you must keep your blood sugar (glucose) under control. You can do this with:  Nutrition.  Exercise.  Lifestyle changes.  Medicines or insulin, if needed.  Support from your doctors and others.  How do I manage my blood sugar?  Check your blood sugar level every day, as often as told.  Call your doctor if your blood sugar is above your goal numbers for 2 tests in a row.  Have your A1c (hemoglobin A1c) level checked at least two times a year. Have it checked more often if your doctor tells you to. Your doctor will set treatment goals for you. Generally, you should have these blood sugar levels:  Before meals (preprandial): 80-130 mg/dL (4.4-7.2 mmol/L).  After meals (postprandial): lower than 180 mg/dL (10 mmol/L).  A1c level: less than 7%.  What do I need to know about high blood sugar? High blood sugar is called hyperglycemia. Know the signs of high blood sugar. Signs may include:  Feeling: ? Thirsty. ? Hungry. ? Very tired.  Needing to pee (urinate) more than usual.  Blurry vision.  What do I need to know about low blood sugar? Low blood sugar is called hypoglycemia. This is when blood sugar is at or below 70 mg/dL (3.9 mmol/L). Symptoms may include:  Feeling: ? Hungry. ? Worried or nervous (anxious). ? Sweaty and clammy. ? Confused. ? Dizzy. ? Sleepy. ? Sick to your stomach (nauseous).  Having: ? A fast heartbeat (palpitations). ? A headache. ? A change in your vision. ? Jerky movements that you cannot control  (seizure). ? Nightmares. ? Tingling or no feeling (numbness) around the mouth, lips, or tongue.  Having trouble with: ? Talking. ? Paying attention (concentrating). ? Moving (coordination). ? Sleeping.  Shaking.  Passing out (fainting).  Getting upset easily (irritability).  Treating low blood sugar  To treat low blood sugar, eat or drink something sugary right away. If you can think clearly and swallow safely, follow the 15:15 rule:  Take 15 grams of a fast-acting carb (carbohydrate). Some fast-acting carbs are: ? 1 tube of glucose gel. ? 3 sugar tablets (glucose pills). ? 6-8 pieces of hard candy. ? 4 oz (120 mL) of fruit juice. ? 4 oz (120 mL) regular (not diet) soda.  Check your blood sugar 15 minutes after you take the carb.  If your blood sugar is still at or below 70 mg/dL (3.9 mmol/L), take 15 grams of a carb again.  If your blood sugar does not go above 70 mg/dL (3.9 mmol/L) after 3 tries, get help right away.  After your blood sugar goes back to normal, eat a meal or a snack within 1 hour.  Treating very low blood sugar If your blood sugar is at or below 54 mg/dL (3 mmol/L), you have very low blood sugar (severe hypoglycemia). This is an emergency. Do not wait to see if the symptoms will go away. Get medical help right away. Call your local emergency services (911 in the [place] Do not drive yourself to the hospital. If you have  very low blood sugar and you cannot eat or drink, you may need a glucagon shot (injection). A family member or friend should learn how to check your blood sugar and how to give you a glucagon shot. Ask your doctor if you need to have a glucagon shot kit at home. What else is important to manage my diabetes? Medicine Follow these instructions about insulin and diabetes medicines:  Take them as told by your doctor.  Adjust them as told by your doctor.  Do not run out of them.  Having diabetes can raise your risk for other long-term  conditions. These include heart or kidney disease. Your doctor may prescribe medicines to help prevent problems from diabetes. Food   Make healthy food choices. These include: ? Chicken, fish, egg whites, and beans. ? Oats, whole wheat, bulgur, brown rice, quinoa, and millet. ? Fresh fruits and vegetables. ? Low-fat dairy products. ? Nuts, avocado, olive oil, and canola oil.  Make a food plan with a specialist (dietitian).  Follow instructions from your doctor about what you cannot eat or drink.  Drink enough fluid to keep your pee (urine) clear or pale yellow.  Eat healthy snacks between healthy meals.  Keep track of carbs that you eat. Read food labels. Learn food serving sizes.  Follow your sick day plan when you cannot eat or drink normally. Make this plan with your doctor so it is ready to use. Activity  Exercise at least 3 times a week.  Do not go more than 2 days without exercising.  Talk with your doctor before you start a new exercise. Your doctor may need to adjust your insulin, medicines, or food. Lifestyle   Do not use any tobacco products. These include cigarettes, chewing tobacco, and e-cigarettes.If you need help quitting, ask your doctor.  Ask your doctor how much alcohol is safe for you.  Learn to deal with stress. If you need help with this, ask your doctor. Body care  Stay up to date with your shots (immunizations).  Have your eyes and feet checked by a doctor as often as told.  Check your skin and feet every day. Check for cuts, bruises, redness, blisters, or sores.  Brush your teeth and gums two times a day.  Floss at least one time a day.  Go to the dentist least one time every 6 months.  Stay at a healthy weight. General instructions   Take over-the-counter and prescription medicines only as told by your doctor.  Share your diabetes care plan with: ? Your work or school. ? People you live with.  Check your pee (urine) for  ketones: ? When you are sick. ? As told by your doctor.  Carry a card or wear jewelry that says that you have diabetes.  Ask your doctor: ? Do I need to meet with a diabetes educator? ? Where can I find a support group for people with diabetes?  Keep all follow-up visits as told by your doctor. This is important. Where to find more information: To learn more about diabetes, visit:  American Diabetes Association: www.diabetes.org  American Association of Diabetes Educators: www.diabeteseducator.org/patient-resources  This information is not intended to replace advice given to you by your health care provider. Make sure you discuss any questions you have with your health care provider. Document Released: 01/12/2016 Document Revised: 02/26/2016 Document Reviewed: 10/24/2015 Elsevier Interactive Patient Education  [employer].

## 2017-10-10 NOTE — Telephone Encounter (Signed)
Records received from prior workup:  Colonoscopy 12/09/2011 - lipoma in proximal transverse colon, diverticulosis of left colon, normal ileum and remainder of examined colon. Prep was reported as good.   Will await her pending procedure scheduled with us.

## 2017-10-10 NOTE — Progress Notes (Signed)
Subjective  CC:  Chief Complaint  Patient presents with  . Follow-up    HPI: Christine Guzman is a 62 y.o. female who presents to the office today for follow up of diabetes and problems listed above in the chief complaint. Established care here last month: brought back today to review her PL after receiving medical records and she has had her GI appt.  Diabetes follow up: Her diabetic control is reported as Worse. It is currently uncontrolled due to being out of metformin x 8 months. Now back on it x 3-4 weeks and tolerating well. sxs of hyperglycemia are improving.  She denies exertional CP or SOB or symptomatic hypoglycemia. She denies foot sores or paresthesias. She is due for her eye exam.  She does have history of diabetic retinopathy.  Complains of red rash underneath her abdominal skin folds.  She has had nystatin cream in the past for this.  No fevers or drainage.  Chronic constipation-I reviewed her notes from gastroenterology.  Records from GI doctors in the past and surgeon in the past revealed that her rectal mass was in fact a chronic fecal impaction that required surgical removal.  She has had no tumors or cancers.  She is scheduled for colonoscopy.  History of Addison's disease-needs referral to endocrinology.  She is back on her hydrocortisone.  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  Chronic opioid dependence and chronic back pain-awaiting pain management referral.  Cardiology records have been reviewed-I have not seen any documentation of cardiac or coronary artery disease.  We discussed this.  We will cancel audiology referral  Familial hyperlipidemia no longer on statins.  She has been on statins in the past but is unclear about how she did on them.   There is no immunization history on file for this patient.  Diabetes Related Lab Review: Lab Results  Component Value Date   HGBA1C 7.6 (H) 09/16/2017   No results found for:  [facility] Lab Results  Component Value Date   CREATININE 0.76 09/16/2017   BUN 11 09/16/2017   NA 139 09/16/2017   K 4.2 09/16/2017   CL 102 09/16/2017   CO2 29 09/16/2017   Lab Results  Component Value Date   CHOL 241 (H) 09/16/2017   Lab Results  Component Value Date   HDL 53.10 09/16/2017   No results found for: [facility] Lab Results  Component Value Date   TRIG 270.0 (H) 09/16/2017   Lab Results  Component Value Date   CHOLHDL 5 09/16/2017   Lab Results  Component Value Date   LDLDIRECT 181.0 09/16/2017   The 10-year ASCVD risk score Denman George DC Jr., et al., 2013) is: 8.6%   Values used to calculate the score:     Age: 57 years     Sex: Female     Is Non-Hispanic African American: No     Diabetic: Yes     Tobacco smoker: No     Systolic Blood Pressure: 120 mmHg     Is BP treated: No     HDL Cholesterol: 53.1 mg/dL     Total Cholesterol: 241 mg/dL I have reviewed the PMH, Fam and Soc history. Patient Active Problem List   Diagnosis Date Noted  . Diabetic retinopathy associated with type 2 diabetes mellitus (HCC) 09/26/2017    Priority: High  . Spinal stenosis, lumbar 09/16/2017    Priority: High  . Ankylosing spondylitis (HCC) 09/16/2017    Priority: High  .  Therapeutic opioid induced constipation 09/16/2017    Priority: High  . Diabetes mellitus type 2, uncontrolled, with complications (HCC) 09/16/2017    Priority: High  . Fibromyalgia 09/16/2017    Priority: High  . Familial hyperlipidemia 09/16/2017    Priority: High  . OSA (obstructive sleep apnea) 09/16/2017    Priority: High  . Chronic narcotic dependence (HCC) 01/28/2014    Priority: High  . Nontoxic multinodular goiter 09/26/2017    Priority: Medium  . Osteoarthritis 09/16/2017    Priority: Medium  . Osteopenia after menopause 09/16/2017    Priority: Medium  . Memory impairment 09/16/2017    Priority: Medium  . GERD (gastroesophageal reflux disease) 09/16/2017    Priority:  Medium  . Fatty liver 09/16/2017    Priority: Medium  . Erosive lichen planus of vulva 09/16/2017    Priority: Low  . Rectal mass - chronic fecal impaction 09/16/2017    Priority: Low  . Nephrolithiasis 09/16/2017    Priority: Low  . Gallstone 09/16/2017    Priority: Low  . Chest pain, non-cardiac 09/16/2017    Priority: Low  . Vitamin D deficiency 09/26/2017    Social History: Patient  reports that  has never smoked. she has never used smokeless tobacco. She reports that she does not drink alcohol or use drugs.  Review of Systems: Ophthalmic: negative for eye pain, loss of vision or double vision Cardiovascular: negative for chest pain Respiratory: negative for SOB or persistent cough Gastrointestinal: negative for abdominal pain Genitourinary: negative for dysuria or gross hematuria MSK: negative for foot lesions Neurologic: negative for weakness or gait disturbance  Objective  Vitals: BP 120/70 (BP Location: Left Arm, Patient Position: Sitting, Cuff Size: Normal)   Pulse 70   Temp 98.1 F (36.7 C) (Oral)   Ht 5\' 2"  (1.575 m)   Wt 166 lb (75.3 kg)   SpO2 96%   BMI 30.36 kg/m  General: well appearing, no acute distress  Psych:  Alert and oriented, normal mood and affect HEENT:  Normocephalic, atraumatic, moist mucous membranes, supple neck  Cardiovascular:  Nl S1 and S2, RRR with systolic murmur, gallop or rub. no edema Respiratory:  Good breath sounds bilaterally, CTAB with normal effort, no rales Gastrointestinal: normal BS, soft, nontender Skin:  Warm, no rashes Neurologic:   Mental status is normal. normal gait Foot exam: no erythema, pallor, or cyanosis visible nl proprioception and sensation to monofilament testing bilaterally, +2 distal pulses bilaterally    Assessment  1. Diabetes mellitus type 2, uncontrolled, with complications (HCC)   2. Familial hyperlipidemia   3. Therapeutic opioid induced constipation   4. Vitamin D deficiency   5. Nontoxic  multinodular goiter   6. Adrenal insufficiency (Addison's disease) (HCC)   7. Breast cancer screening   8. Intertrigo   9. Rectal mass - chronic fecal impaction      Plan   Diabetes is currently marginally controlled.  Continue metformin and improve diet and we will recheck an A1c in about 6-8 weeks.  Additional medications at that time if not at goal.  Patient to schedule an eye exam.  Today updated her pneumococcal vaccination.  Diabetic education given in the after visit summary.  Check urine for microalbuminuria.  Blood pressure is well controlled and she is not on an ACE inhibitor at this time.  Treat intertrigo with ketoconazole and then nystatin powder as needed.  Hyperlipidemia: Recommend starting Crestor 20 mg nightly and rechecking in 6-12 weeks.  Noncardiac chest pain, cancel cardiology referral  History of vitamin D deficiency on high-dose supplementation for years-check levels.  Patient to discuss with GI she is concerned about malabsorption.  I have warned about vitamin D toxicity.  She can also discuss with endocrinology  History of nontoxic multinodular goiter with normal free T4 levels.  Continue to monitor exam is stable.  Adrenal insufficiency: Refer to endocrinology for management, chronic.  Health maintenance: Tdap updated today, referral made for mammogram. Diabetic education: ongoing education regarding chronic disease management for diabetes was given today. We continue to reinforce the ABC's of diabetic management: A1c (<7 or 8 dependent upon patient), tight blood pressure control, and cholesterol management with goal LDL < 100 minimally. We discuss diet strategies, exercise recommendations, medication options and possible side effects. At each visit, we review recommended immunizations and preventive care recommendations for diabetics and stress that good diabetic control can prevent other problems. See below for this patient's data.  Follow up: Return in about 6  weeks (around 11/21/2017) for follow up of diabetes and hypertension..   Commons side effects, risks, benefits, and alternatives for medications and treatment plan prescribed today were discussed, and the patient expressed understanding of the given instructions. Patient is instructed to call or message via MyChart if he/she has any questions or concerns regarding our treatment plan. No barriers to understanding were identified. We discussed Red Flag symptoms and signs in detail. Patient expressed understanding regarding what to do in case of urgent or emergency type symptoms.   Medication list was reconciled, printed and provided to the patient in AVS. Patient instructions and summary information was reviewed with the patient as documented in the AVS. This note was prepared with assistance of Dragon voice recognition software. Occasional wrong-word or sound-a-like substitutions may have occurred due to the inherent limitations of voice recognition software  Orders Placed This Encounter  Procedures  . MM DIGITAL SCREENING BILATERAL  . VITAMIN D 25 Hydroxy (Vit-D Deficiency, Fractures)  . Microalbumin / creatinine urine ratio  . Ambulatory referral to Endocrinology   Meds ordered this encounter  Medications  . rosuvastatin (CRESTOR) 20 MG tablet    Sig: Take 1 tablet (20 mg total) by mouth at bedtime.    Dispense:  90 tablet    Refill:  3  . nystatin (MYCOSTATIN/NYSTOP) powder    Sig: Apply topically 2 (two) times daily.    Dispense:  45 g    Refill:  0  . ketoconazole (NIZORAL) 2 % cream    Sig: Apply 1 application topically daily.    Dispense:  15 g    Refill:  0

## 2017-10-10 NOTE — Addendum Note (Signed)
Addended by: Jimmye NormanPHANOS, Caedyn Tassinari J on: 10/10/2017 09:07 AM   Modules accepted: Orders

## 2017-10-12 ENCOUNTER — Encounter: Payer: Self-pay | Admitting: Family Medicine

## 2017-10-13 NOTE — Telephone Encounter (Signed)
Please call to check on status of pain mgt referral and then give feedback to patient.   Also, please cancel cardiology referral.  Thank you!

## 2017-10-13 NOTE — Telephone Encounter (Signed)
Please fill out handicap placard app for patient.  Permanent, due to pain restricting walking  Then notify pt when ready for pick up.   Thank you

## 2017-10-14 ENCOUNTER — Encounter: Payer: Self-pay | Admitting: Family Medicine

## 2017-10-14 ENCOUNTER — Telehealth: Payer: Self-pay | Admitting: Family Medicine

## 2017-10-14 NOTE — Telephone Encounter (Signed)
Copied from CRM [phone]. Topic: General - Other >> Oct 14, 2017 10:36 AM Gerrianne ScalePayne, Neal Trulson L wrote: ,Reason for CRM: patient calling stating that she had did exactly what Marylene Landngela told her to do, to go to [facility] to fill out release of medical records and they had told the patient that was not the proper procedure there they wouldn't allow patient to fill out any paper work

## 2017-10-14 NOTE — Telephone Encounter (Signed)
Did you need me to call [facility]?

## 2017-10-14 NOTE — Telephone Encounter (Signed)
[facility] since they are requesting this before they will schedule an appt.

## 2017-10-23 ENCOUNTER — Other Ambulatory Visit: Payer: Self-pay | Admitting: Family Medicine

## 2017-10-31 ENCOUNTER — Encounter: Payer: Self-pay | Admitting: Cardiology

## 2017-10-31 ENCOUNTER — Encounter: Payer: Self-pay | Admitting: Family Medicine

## 2017-10-31 ENCOUNTER — Ambulatory Visit: Payer: BLUE CROSS/BLUE SHIELD | Admitting: Cardiology

## 2017-10-31 VITALS — BP 142/82 | HR 62 | Ht 61.0 in | Wt 167.0 lb

## 2017-10-31 DIAGNOSIS — E7801 Familial hypercholesterolemia: Secondary | ICD-10-CM | POA: Diagnosis not present

## 2017-10-31 DIAGNOSIS — R0609 Other forms of dyspnea: Secondary | ICD-10-CM | POA: Diagnosis not present

## 2017-10-31 DIAGNOSIS — E119 Type 2 diabetes mellitus without complications: Secondary | ICD-10-CM | POA: Diagnosis not present

## 2017-10-31 NOTE — Progress Notes (Signed)
Cardiology Office Note   Date:  10/31/2017   ID:  Christine Guzman, DOB [birth year], MRN [id number]  PCP:  Leamon Arnt, MD  Cardiologist:   France Lusty Martinique, MD   Chief Complaint  Patient presents with  . Follow-up    NP.  Christine Guzman Headache  . Shortness of Breath      History of Present Illness: Christine Guzman is a 62 y.o. female who is seen at the request of Dr. Jonni Sanger for evaluation of dyspnea. She has a history of familial hypercholesterolemia and DM type 2. She has a very strong family history of CAD. She reports she has been followed by cardiology for over 20 years and will typically get a stress test every other year. Her last evaluation was in 2017 with Dr. Arbutus Leas in [place], [place]. She had a normal Myoview study then. Echo showed normal LV function with mild AI and TR. Normal pulmonary pressures.  She reports chronic SOB on exertion for 15-20 years. Sometimes it is associated with chest pain. She was on metoprolol for years but stopped taking it when she moved. Thinks it may have helped a little. No documented history of CAD/CV disease. She reports her father was a Film/video editor at [employer].    Past Medical History:  Diagnosis Date  . Adrenal insufficiency ([place])   . Ankylosing spondylitis ([place]) 09/16/2017  . CAD (coronary artery disease), native coronary artery 09/16/2017   LVH, nuclear stress test, no MI or cardiac cath  . Diabetes ([place])   . Diabetic retinopathy associated with type 2 diabetes mellitus ([place]) 09/26/2017  . Diverticulosis   . Fibromyalgia   . Gallstone   . GERD (gastroesophageal reflux disease)   . Kidney stone   . Memory impairment 09/16/2017   Neurologist in [facility]  . Osteopenia after menopause 09/16/2017  . PVC (premature ventricular contraction)    mild MR and trivial AR/TR on echo 2009  . Sleep apnea   . Spinal stenosis    disc disease in the C-spine, T-spine, L-spine, nerve damage (pain management  . Spinal stenosis, lumbar 09/16/2017   Chronic pain mgt  . Thyroid nodule   . Vitamin D deficiency     Past Surgical History:  Procedure Laterality Date  . ABDOMINAL HYSTERECTOMY  07/2000  . CESAREAN SECTION     x 4  . COLON SURGERY  09/2015   [facility] [place]  . LITHOTRIPSY    . TONSILLECTOMY    . TUBAL LIGATION       Current Outpatient Medications  Medication Sig Dispense Refill  . aspirin 81 MG tablet Take 81 mg by mouth daily.    . Blood Glucose Monitoring Suppl (ONETOUCH VERIO) w/Device KIT 1 kit by Does not apply route 2 (two) times daily as needed. 1 kit 0  . clobetasol cream (TEMOVATE) 3.33 % Apply 1 application topically 2 (two) times daily. 30 g 0  . esomeprazole (NEXIUM) 40 MG capsule Take 1 capsule (40 mg total) by mouth 2 (two) times daily before a meal. 180 capsule 3  . estradiol (ESTRACE) 0.1 MG/GM vaginal cream Place 1 Applicatorful vaginally 3 (three) times a week. 42.5 g 1  . hydrocortisone (CORTEF) 5 MG tablet Take 2 tablets (10 mg total) by mouth every morning AND 1 tablet (5 mg total) at bedtime. 270 tablet 3  . ketoconazole (NIZORAL) 2 % cream Apply 1 application topically daily. 15 g 0  . magnesium citrate ([employer]S CITRATE OF MAGNESIA) SOLN Take by  mouth.    . metFORMIN (GLUCOPHAGE-XR) 500 MG 24 hr tablet Take 2 tablets (1,000 mg total) by mouth daily with breakfast. 90 tablet 3  . morphine (KADIAN) 60 MG 24 hr capsule TAKE ONE CAPSULE BY MOUTH EVERY 12 HOURS FOR SEVERE SPIINE PAIN  0  . nystatin (MYCOSTATIN/NYSTOP) powder Apply topically 2 (two) times daily. 45 g 0  . ONETOUCH DELICA LANCETS 20U MISC USE TO CHECK BLOOD SUGAR TWICE A DAY AND PRN 200 each 2  . ONETOUCH VERIO test strip USE TO CHECK BLOOD SUGARS TWICE A DAY AND AS NEEDED **DX CODE E11.9 100 each 3  . rosuvastatin (CRESTOR) 20 MG tablet Take 1 tablet (20 mg total) by mouth at bedtime. 90 tablet 3  . Vitamin D, Ergocalciferol, (DRISDOL) 50000 UNITS CAPS capsule Take 50,000 Units by mouth 2 (two) times a week.        No current facility-administered medications for this visit.     Allergies:   Amoxicillin and Sulfa antibiotics    Social History:  The patient  reports that  has never smoked. she has never used smokeless tobacco. She reports that she does not drink alcohol or use drugs.   Family History:  The patient's family history includes Cirrhosis in her son; Diabetes in her daughter and mother; Heart disease in her brother and daughter; Hypertension in her son; Liver cancer in her mother; Lung cancer in her father; Nephrolithiasis in her daughter; Polycystic ovary syndrome in her daughter and daughter; Stroke in her brother and son.  She reports she is one of 12 children and all her siblings have CV disease. Her daughter died age 58 with complications of PCOD. Son age 64 just had CABG.    ROS:  Please see the history of present illness.   Otherwise, review of systems are positive for.   All other systems are reviewed and negative.    PHYSICAL EXAM: VS:  BP (!) 142/82 Comment: right arm.  Pulse 62   Ht '5\' 1"'$  (1.549 m)   Wt 167 lb (75.8 kg)   BMI 31.55 kg/m  , BMI Body mass index is 31.55 kg/m. GEN: Well nourished, well developed, in no acute distress  HEENT: normal  Neck: no JVD, carotid bruits, or masses Cardiac: RRR; no murmurs, rubs, or gallops,no edema  Respiratory:  clear to auscultation bilaterally, normal work of breathing GI: soft, nontender, nondistended, + BS MS: no deformity or atrophy  Skin: warm and dry, no rash Neuro:  Strength and sensation are intact Psych: euthymic mood, full affect   EKG:  EKG is ordered today. The ekg ordered today demonstrates NSR with LAD and LVH by voltage. I have personally reviewed and interpreted this study.    Recent Labs: 09/16/2017: ALT 17; BUN 11; Creatinine, Ser 0.76; Hemoglobin 14.4; Platelets 274.0; Potassium 4.2; Sodium 139; TSH 4.65    Lipid Panel    Component Value Date/Time   CHOL 241 (H) 09/16/2017 1212   TRIG 270.0 (H)  09/16/2017 1212   HDL 53.10 09/16/2017 1212   CHOLHDL 5 09/16/2017 1212   VLDL 54.0 (H) 09/16/2017 1212   LDLDIRECT 181.0 09/16/2017 1212      Wt Readings from Last 3 Encounters:  10/31/17 167 lb (75.8 kg)  10/10/17 166 lb (75.3 kg)  09/23/17 164 lb (74.4 kg)      Other studies Reviewed: Additional studies/ records that were reviewed today include: Echo and Myoview study from 2017. Review of the above records demonstrates: see HPI   ASSESSMENT  AND PLAN:  1.  Chronic dyspnea on exertion and chest pressure. Prior cardiac evaluation low risk and symptoms really have not changed for 15 years. She does have multiple CV risk factors and she is intermediate risk for CV disease in next 10 yrs. At this point would focus more on risk factor modification. If symptoms of dyspnea or chest pain change then I will be happy to see back and reassess. Encourage regular aerobic exercise.  2. Hyperlipidemia. Mixed. Above labs were on no therapy. Agree with high dose statin therapy with Crestor. If lipids still not at goal I would have a low threshold for adding a PCSK 9 inhibitor like Repatha. Goal LDL 70. 3. DM type 2. Currently on metformin. Likewise if A1c not at goal on metformin I would consider adding Jardiance given data about lowering CV risk.    Current medicines are reviewed at length with the patient today.  The patient does not have concerns regarding medicines.  The following changes have been made:  no change  Labs/ tests ordered today include:  No orders of the defined types were placed in this encounter.    Disposition:   FU with me prn   Signed, Emitt Maglione Martinique, MD  10/31/2017 2:56 PM    [facility] [address] [place], [place], [address] Phone [phone], Fax [phone]

## 2017-10-31 NOTE — Patient Instructions (Signed)
Continue your current therapy  If your lipids are not at goal on Crestor I would consider addition of Repatha  If your diabetes mellitus is not controlled on metformin I would consider Jardiance.  I will be happy to see you if your symptoms of chest pain or dyspnea get worse.

## 2017-11-08 ENCOUNTER — Encounter: Payer: Self-pay | Admitting: Gastroenterology

## 2017-11-08 ENCOUNTER — Ambulatory Visit: Payer: BLUE CROSS/BLUE SHIELD | Admitting: Cardiology

## 2017-11-09 ENCOUNTER — Encounter: Payer: Self-pay | Admitting: Gastroenterology

## 2017-11-11 ENCOUNTER — Other Ambulatory Visit: Payer: Self-pay

## 2017-11-11 ENCOUNTER — Encounter: Payer: Self-pay | Admitting: Family Medicine

## 2017-11-11 ENCOUNTER — Ambulatory Visit: Payer: BLUE CROSS/BLUE SHIELD | Admitting: Family Medicine

## 2017-11-11 VITALS — BP 122/78 | HR 84 | Temp 98.7°F | Ht 61.0 in | Wt 164.6 lb

## 2017-11-11 DIAGNOSIS — J0100 Acute maxillary sinusitis, unspecified: Secondary | ICD-10-CM

## 2017-11-11 DIAGNOSIS — J029 Acute pharyngitis, unspecified: Secondary | ICD-10-CM | POA: Diagnosis not present

## 2017-11-11 LAB — POCT RAPID STREP A (OFFICE): Rapid Strep A Screen: NEGATIVE

## 2017-11-11 MED ORDER — PREDNISONE 20 MG PO TABS: ORAL_TABLET | ORAL | 0 refills | Status: DC

## 2017-11-11 MED ORDER — GUAIFENESIN-CODEINE 100-10 MG/5ML PO SOLN: 5.0000 mL | Freq: Four times a day (QID) | ORAL | 0 refills | Status: DC | PRN

## 2017-11-11 MED ORDER — LEVOFLOXACIN 500 MG PO TABS: 500.0000 mg | ORAL_TABLET | Freq: Every day | ORAL | 0 refills | Status: DC

## 2017-11-11 NOTE — Addendum Note (Signed)
Addended byDene Gentry: PETERMAN, AMY M on: 11/11/2017 02:27 PM   Modules accepted: Orders

## 2017-11-11 NOTE — Progress Notes (Signed)
Subjective   CC:  Chief Complaint  Patient presents with  . Sore Throat    Symptoms for the past 2-3 weeks   . Nasal Congestion  . Facial Pain  . Cough  . Fever    HPI: Christine Guzman is a 62 y.o. female who presents to the office today to address the problems listed above in the chief complaint.  Patient reports sinus congestion and pressure with thick drainage, mild nonproductive cough, ear pressure without pain, and mild malaise.  Symptoms have been present for several days.Shedenies high fevers, GI symptoms, shortness of breath she also has a swollen painful throat x 2 weeks, worse over last several days. Niece with strep. Shehas had sinus infections in the past and this feels similar.  Patient is not a non-smoker.  No history of asthma or COPD.  I reviewed the patients updated PMH, FH, and SocHx.    Patient Active Problem List   Diagnosis Date Noted  . Diabetic retinopathy associated with type 2 diabetes mellitus ([place]) 09/26/2017    Priority: High  . Spinal stenosis, lumbar 09/16/2017    Priority: High  . Ankylosing spondylitis ([place]) 09/16/2017    Priority: High  . Therapeutic opioid induced constipation 09/16/2017    Priority: High  . Diabetes mellitus type 2, uncontrolled, with complications ([place]) 62/37/6283    Priority: High  . Fibromyalgia 09/16/2017    Priority: High  . Familial hyperlipidemia 09/16/2017    Priority: High  . OSA (obstructive sleep apnea) 09/16/2017    Priority: High  . Chronic narcotic dependence ([place]) 01/28/2014    Priority: High  . Nontoxic multinodular goiter 09/26/2017    Priority: Medium  . Osteoarthritis 09/16/2017    Priority: Medium  . Osteopenia after menopause 09/16/2017    Priority: Medium  . Memory impairment 09/16/2017    Priority: Medium  . GERD (gastroesophageal reflux disease) 09/16/2017    Priority: Medium  . Fatty liver 09/16/2017    Priority: Medium  . Erosive lichen planus of vulva 09/16/2017    Priority: Low  .  Rectal mass - chronic fecal impaction 09/16/2017    Priority: Low  . Nephrolithiasis 09/16/2017    Priority: Low  . Gallstone 09/16/2017    Priority: Low  . Chest pain, non-cardiac 09/16/2017    Priority: Low  . Vitamin D deficiency 09/26/2017   Current Meds  Medication Sig  . aspirin 81 MG tablet Take 81 mg by mouth daily.  . Blood Glucose Monitoring Suppl (ONETOUCH VERIO) w/Device KIT 1 kit by Does not apply route 2 (two) times daily as needed.  . clobetasol cream (TEMOVATE) 1.51 % Apply 1 application topically 2 (two) times daily.  Marland Kitchen esomeprazole (NEXIUM) 40 MG capsule Take 1 capsule (40 mg total) by mouth 2 (two) times daily before a meal.  . estradiol (ESTRACE) 0.1 MG/GM vaginal cream Place 1 Applicatorful vaginally 3 (three) times a week.  . hydrocortisone (CORTEF) 5 MG tablet Take 2 tablets (10 mg total) by mouth every morning AND 1 tablet (5 mg total) at bedtime.  Marland Kitchen ketoconazole (NIZORAL) 2 % cream Apply 1 application topically daily.  . magnesium citrate (CVS CITRATE OF MAGNESIA) SOLN Take by mouth.  . metFORMIN (GLUCOPHAGE-XR) 500 MG 24 hr tablet Take 2 tablets (1,000 mg total) by mouth daily with breakfast.  . morphine (KADIAN) 60 MG 24 hr capsule TAKE ONE CAPSULE BY MOUTH EVERY 12 HOURS FOR SEVERE SPIINE PAIN  . nystatin (MYCOSTATIN/NYSTOP) powder Apply topically 2 (two) times daily.  Marland Kitchen  ONETOUCH DELICA LANCETS 16R MISC USE TO CHECK BLOOD SUGAR TWICE A DAY AND PRN  . ONETOUCH VERIO test strip USE TO CHECK BLOOD SUGARS TWICE A DAY AND AS NEEDED **DX CODE E11.9  . rosuvastatin (CRESTOR) 20 MG tablet Take 1 tablet (20 mg total) by mouth at bedtime.  . Vitamin D, Ergocalciferol, (DRISDOL) 50000 UNITS CAPS capsule Take 50,000 Units by mouth 2 (two) times a week.     Review of Systems: Cardiovascular: negative for chest pain Respiratory: negative for SOB or persistent cough Gastrointestinal: negative for abdominal pain Genitourinary: negative for dysuria or gross  hematuria  Objective  Vitals: BP 122/78   Pulse 84   Temp 98.7 F (37.1 C)   Ht _0  (1.549 m)   Wt 164 lb 9.6 oz (74.7 kg)   SpO2 98%   BMI 31.10 kg/m  General: no acute distress  Psych:  Alert and oriented, normal mood and affect HEENT:  Normocephalic, atraumatic, TMs with serous effusions or retraction w/o erythema, nasal mucosa is red with purulent drainage, tender maxillary sinus present, OP swollen and erythematous w/o eudate, midline uvula, supple neck with LAD Cardiovascular:  RRR without murmur or gallop. no peripheral edema Respiratory:  Good breath sounds bilaterally, CTAB with normal respiratory effort Skin:  Warm, no rashes Neurologic:   Mental status is normal. normal gait Rapid strep test: negativve  Assessment  1. Acute non-recurrent maxillary sinusitis   2. Pharyngitis, unspecified etiology      Plan    Sinusitis: History and exam is most consistent with bacterial sinus infection.  Etiology and prognosis discussed with patient.  Recommend antibiotics as ordered below.  Patient to complete course of antibiotics, use supportive medications like mucolytics and decongestants as needed.  May use Tylenol or Advil if needed.  Symptoms should improve over the next 2 weeks.  Patient will return or call if symptoms persist or worsen. Pt requests levaquin.  Pharyngitis: pred burst; monitor sugars.  Follow up: Return for as scheduled.    Commons side effects, risks, benefits, and alternatives for medications and treatment plan prescribed today were discussed, and the patient expressed understanding of the given instructions. Patient is instructed to call or message via MyChart if he/she has any questions or concerns regarding our treatment plan. No barriers to understanding were identified. We discussed Red Flag symptoms and signs in detail. Patient expressed understanding regarding what to do in case of urgent or emergency type symptoms.   Medication list was reconciled,  printed and provided to the patient in AVS. Patient instructions and summary information was reviewed with the patient as documented in the AVS. This note was prepared with assistance of Dragon voice recognition software. Occasional wrong-word or sound-a-like substitutions may have occurred due to the inherent limitations of voice recognition software  No orders of the defined types were placed in this encounter.  Meds ordered this encounter  Medications  . levofloxacin (LEVAQUIN) 500 MG tablet    Sig: Take 1 tablet (500 mg total) by mouth daily.    Dispense:  14 tablet    Refill:  0  . predniSONE (DELTASONE) 20 MG tablet    Sig: Take 2 tabs daily for 5 days    Dispense:  10 tablet    Refill:  0  . guaiFENesin-codeine 100-10 MG/5ML syrup    Sig: Take 5 mLs by mouth every 6 (six) hours as needed for cough.    Dispense:  120 mL    Refill:  0

## 2017-11-14 ENCOUNTER — Encounter: Payer: Self-pay | Admitting: Family Medicine

## 2017-11-15 MED ORDER — CEFDINIR 300 MG PO CAPS: 300.0000 mg | ORAL_CAPSULE | Freq: Two times a day (BID) | ORAL | 0 refills | Status: DC

## 2017-11-22 ENCOUNTER — Encounter: Payer: BLUE CROSS/BLUE SHIELD | Admitting: Gastroenterology

## 2017-12-09 ENCOUNTER — Encounter: Payer: Self-pay | Admitting: Endocrinology

## 2017-12-31 ENCOUNTER — Encounter: Payer: Self-pay | Admitting: Family Medicine

## 2018-01-02 ENCOUNTER — Other Ambulatory Visit: Payer: Self-pay | Admitting: Family Medicine

## 2018-01-02 ENCOUNTER — Ambulatory Visit: Payer: BLUE CROSS/BLUE SHIELD | Admitting: Family Medicine

## 2018-01-02 ENCOUNTER — Telehealth: Payer: Self-pay

## 2018-01-02 ENCOUNTER — Other Ambulatory Visit: Payer: Self-pay

## 2018-01-02 ENCOUNTER — Encounter: Payer: Self-pay | Admitting: Family Medicine

## 2018-01-02 VITALS — BP 114/82 | HR 68 | Temp 98.3°F | Resp 15 | Ht 61.0 in | Wt 169.6 lb

## 2018-01-02 DIAGNOSIS — N6341 Unspecified lump in right breast, subareolar: Secondary | ICD-10-CM

## 2018-01-02 DIAGNOSIS — IMO0002 Reserved for concepts with insufficient information to code with codable children: Secondary | ICD-10-CM

## 2018-01-02 DIAGNOSIS — E1165 Type 2 diabetes mellitus with hyperglycemia: Secondary | ICD-10-CM

## 2018-01-02 DIAGNOSIS — E118 Type 2 diabetes mellitus with unspecified complications: Secondary | ICD-10-CM

## 2018-01-02 DIAGNOSIS — E782 Mixed hyperlipidemia: Secondary | ICD-10-CM

## 2018-01-02 DIAGNOSIS — M797 Fibromyalgia: Secondary | ICD-10-CM

## 2018-01-02 LAB — COMPREHENSIVE METABOLIC PANEL
ALK PHOS: 54 U/L (ref 39–117)
ALT: 18 U/L (ref 0–35)
AST: 20 U/L (ref 0–37)
Albumin: 3.8 g/dL (ref 3.5–5.2)
BILIRUBIN TOTAL: 0.4 mg/dL (ref 0.2–1.2)
BUN: 9 mg/dL (ref 6–23)
CO2: 33 mEq/L — ABNORMAL HIGH (ref 19–32)
Calcium: 9.3 mg/dL (ref 8.4–10.5)
Chloride: 103 mEq/L (ref 96–112)
Creatinine, Ser: 0.63 mg/dL (ref 0.40–1.20)
GFR: 101.26 mL/min (ref >60.00)
GLUCOSE: 112 mg/dL — AB (ref 70–99)
Potassium: 4 mEq/L (ref 3.5–5.1)
SODIUM: 143 meq/L (ref 135–145)
TOTAL PROTEIN: 6.7 g/dL (ref 6.0–8.3)

## 2018-01-02 LAB — LDL CHOLESTEROL, DIRECT: LDL DIRECT: 180 mg/dL

## 2018-01-02 LAB — LIPID PANEL
Cholesterol: 259 mg/dL — ABNORMAL HIGH (ref 0–200)
HDL: 49.3 mg/dL (ref >39.00)
NonHDL: 209.85
Total CHOL/HDL Ratio: 5
Triglycerides: 239 mg/dL — ABNORMAL HIGH (ref 0.0–149.0)
VLDL: 47.8 mg/dL — AB (ref 0.0–40.0)

## 2018-01-02 LAB — POCT GLYCOSYLATED HEMOGLOBIN (HGB A1C): HEMOGLOBIN A1C: 7.1

## 2018-01-02 NOTE — Patient Instructions (Signed)
Please return in 3 months for recheck.  We will call you with information regarding your referral appointment. Mammogram and ultrasound if needed to check your breast lump found on exam.   Work on getting more rest and exposure to sunlight for 10 mintues / day.    If you have any questions or concerns, please don't hesitate to send me a message via MyChart or call the office at [phone]. Thank you for visiting with us today! It's our pleasure caring for you.

## 2018-01-02 NOTE — Telephone Encounter (Signed)
Called patient and left a message to please call our office back to schedule the first available appointment with Dr. Mardelle MatteAndy in regards to the painful lump in right breast from MyChart message.  CRM Created.  OK for PEC to schedule first available appointment with Dr. Mardelle MatteAndy.

## 2018-01-02 NOTE — Progress Notes (Signed)
Subjective  CC:  Chief Complaint  Patient presents with  . Breast Mass    Lump on right breast, has been there a while, severe pain    HPI: Christine Guzman is a 62 y.o. female who presents to the office today to address the problems listed above in the chief complaint.same day appt for above. Also needs DM and hyperlipidemia f/u:  Has had lump on right breast.  She reports she has had a lump for several months but over the last 2 weeks and has become tender.  She also feels that it is enlarged.  She denies nipple drainage, other masses.  She is overdue for a mammogram.  This was ordered in January but she never scheduled.  She is average risk for breast cancer.  Fibromyalgia: She reports tenderness on right side shoulder and neck.  She does admit to increased stressors, she is worried about her memory, her sleep has been negatively affected as well.  Diabetes follow-up: She is been taking her metformin 1000 mg twice daily faithfully.  Denies symptoms of hyperglycemia.  No foot sores or pain.  She is due for an A1c.  She has failed Bydureon and Januvia in the past  Hyperlipidemia f/u: She started Crestor in December and has been taking it nightly without myalgias or adverse effects.  She is due for lipid recheck and LFT recheck.  She does have an appointment with Dr. Dwyane Dee, endocrinology this week to assess her history of adrenal insufficiency.  I reviewed the patients updated PMH, FH, and SocHx.    Patient Active Problem List   Diagnosis Date Noted  . Diabetic retinopathy associated with type 2 diabetes mellitus ([place]) 09/26/2017    Priority: High  . Spinal stenosis, lumbar 09/16/2017    Priority: High  . Ankylosing spondylitis ([place]) 09/16/2017    Priority: High  . Therapeutic opioid induced constipation 09/16/2017    Priority: High  . Diabetes mellitus type 2, uncontrolled, with complications ([place]) 57/32/2025    Priority: High  . Fibromyalgia 09/16/2017    Priority: High  .  Familial hyperlipidemia 09/16/2017    Priority: High  . OSA (obstructive sleep apnea) 09/16/2017    Priority: High  . Chronic narcotic dependence ([place]) 01/28/2014    Priority: High  . Nontoxic multinodular goiter 09/26/2017    Priority: Medium  . Osteoarthritis 09/16/2017    Priority: Medium  . Osteopenia after menopause 09/16/2017    Priority: Medium  . Memory impairment 09/16/2017    Priority: Medium  . GERD (gastroesophageal reflux disease) 09/16/2017    Priority: Medium  . Fatty liver 09/16/2017    Priority: Medium  . Erosive lichen planus of vulva 09/16/2017    Priority: Low  . Rectal mass - chronic fecal impaction 09/16/2017    Priority: Low  . Nephrolithiasis 09/16/2017    Priority: Low  . Gallstone 09/16/2017    Priority: Low  . Chest pain, non-cardiac 09/16/2017    Priority: Low  . Vitamin D deficiency 09/26/2017   Current Meds  Medication Sig  . aspirin 81 MG tablet Take 81 mg by mouth daily.  . Blood Glucose Monitoring Suppl (ONETOUCH VERIO) w/Device KIT 1 kit by Does not apply route 2 (two) times daily as needed.  . clobetasol cream (TEMOVATE) 4.27 % Apply 1 application topically 2 (two) times daily.  Marland Kitchen esomeprazole (NEXIUM) 40 MG capsule Take 1 capsule (40 mg total) by mouth 2 (two) times daily before a meal.  . estradiol (ESTRACE) 0.1 MG/GM vaginal  cream Place 1 Applicatorful vaginally 3 (three) times a week.  . hydrocortisone (CORTEF) 5 MG tablet Take 2 tablets (10 mg total) by mouth every morning AND 1 tablet (5 mg total) at bedtime.  Marland Kitchen ketoconazole (NIZORAL) 2 % cream Apply 1 application topically daily.  . magnesium citrate (CVS CITRATE OF MAGNESIA) SOLN Take by mouth.  . metFORMIN (GLUCOPHAGE-XR) 500 MG 24 hr tablet Take 2 tablets (1,000 mg total) by mouth daily with breakfast.  . morphine (KADIAN) 60 MG 24 hr capsule TAKE ONE CAPSULE BY MOUTH EVERY 12 HOURS FOR SEVERE SPIINE PAIN  . nystatin (MYCOSTATIN/NYSTOP) powder Apply topically 2 (two) times daily.    Glory Rosebush DELICA LANCETS 56O MISC USE TO CHECK BLOOD SUGAR TWICE A DAY AND PRN  . ONETOUCH VERIO test strip USE TO CHECK BLOOD SUGARS TWICE A DAY AND AS NEEDED **DX CODE E11.9  . rosuvastatin (CRESTOR) 20 MG tablet Take 1 tablet (20 mg total) by mouth at bedtime.  . Vitamin D, Ergocalciferol, (DRISDOL) 50000 UNITS CAPS capsule Take 50,000 Units by mouth 2 (two) times a week.   . [DISCONTINUED] cefdinir (OMNICEF) 300 MG capsule Take 1 capsule (300 mg total) by mouth 2 (two) times daily.  . [DISCONTINUED] guaiFENesin-codeine 100-10 MG/5ML syrup Take 5 mLs by mouth every 6 (six) hours as needed for cough.  . [DISCONTINUED] levofloxacin (LEVAQUIN) 500 MG tablet Take 1 tablet (500 mg total) by mouth daily.  . [DISCONTINUED] predniSONE (DELTASONE) 20 MG tablet Take 2 tabs daily for 5 days    Allergies: Patient is allergic to amoxicillin and sulfa antibiotics. Family History: Patient family history includes Cirrhosis in her son; Diabetes in her daughter and mother; Heart disease in her brother and daughter; Hypertension in her son; Liver cancer in her mother; Lung cancer in her father; Nephrolithiasis in her daughter; Polycystic ovary syndrome in her daughter and daughter; Stroke in her brother and son. Social History:  Patient  reports that she has never smoked. She has never used smokeless tobacco. She reports that she does not drink alcohol or use drugs.  Review of Systems: Constitutional: Negative for fever malaise or anorexia Cardiovascular: negative for chest pain Respiratory: negative for SOB or persistent cough Gastrointestinal: negative for abdominal pain  Objective  Vitals: BP 114/82   Pulse 68   Temp 98.3 F (36.8 C) (Oral)   Resp 15   Ht '5\' 1"'$  (1.549 m)   Wt 169 lb 9.6 oz (76.9 kg)   SpO2 97%   BMI 32.05 kg/m  General: no acute distress , A&Ox3 HEENT: PEERL, conjunctiva normal, Oropharynx moist,neck is supple Cardiovascular:  RRR without murmur or gallop.  Respiratory:   Good breath sounds bilaterally, CTAB with normal respiratory effort Skin:  warm, no rashes Breast exam: Normal left breast without mass or nipple discharge or adenopathy, right breast with 5-68m tender mobile mass, subareolar without nipple discharge, no other masses noted.  Patient has tenderness throughout the entire breast and right upper chest wall.  No axillary lymphadenopathy present. Diabetic Foot Exam: Appearance - no lesions, ulcers or calluses Skin - no sigificant pallor or erythema Monofilament testing - sensitive bilaterally in following locations:  Right - Great toe, medial, central, lateral ball and posterior foot intact  Left - Great toe, medial, central, lateral ball and posterior foot intact Pulses - +2 distally bilaterally  Lab Results  Component Value Date   HGBA1C 7.1 01/02/2018   HGBA1C 7.6 (H) 09/16/2017    Lab Results  Component Value Date  CHOL 241 (H) 09/16/2017   Lab Results  Component Value Date   HDL 53.10 09/16/2017   No results found for: [facility] Lab Results  Component Value Date   TRIG 270.0 (H) 09/16/2017   Lab Results  Component Value Date   CHOLHDL 5 09/16/2017   Lab Results  Component Value Date   LDLDIRECT 181.0 09/16/2017    Assessment  1. Subareolar lump of right breast   2. Diabetes mellitus type 2, uncontrolled, with complications ([place])   3. Fibromyalgia   4. Mixed hyperlipidemia      Plan   Right subareolar breast mass: Recommend imaging, mammogram and ultrasonography ordered.  Further recommendations based on results.  Type 2 diabetes is better controlled with medications.  Now to work on diet and increasing activity.  Recheck 3 months.  Additional medications if A1c remains above 7.  Continue ACE inhibitor.  Patient does need an eye exam.  Again encouraged to schedule  Hyperlipidemia on Crestor 20 mg.  Recheck levels today.  Fibromyalgia: Active, counseling done.  Follow up: 3 months to recheck diabetes.   Commons  side effects, risks, benefits, and alternatives for medications and treatment plan prescribed today were discussed, and the patient expressed understanding of the given instructions. Patient is instructed to call or message via MyChart if he/she has any questions or concerns regarding our treatment plan. No barriers to understanding were identified. We discussed Red Flag symptoms and signs in detail. Patient expressed understanding regarding what to do in case of urgent or emergency type symptoms.   Medication list was reconciled, printed and provided to the patient in AVS. Patient instructions and summary information was reviewed with the patient as documented in the AVS. This note was prepared with assistance of Dragon voice recognition software. Occasional wrong-word or sound-a-like substitutions may have occurred due to the inherent limitations of voice recognition software  Orders Placed This Encounter  Procedures  . MM DIGITAL SCREENING BILATERAL  . Comprehensive metabolic panel  . Lipid panel  . POCT glycosylated hemoglobin (Hb A1C)   No orders of the defined types were placed in this encounter.

## 2018-01-03 MED ORDER — ROSUVASTATIN CALCIUM 40 MG PO TABS: 40.0000 mg | ORAL_TABLET | Freq: Every day | ORAL | 1 refills | Status: AC

## 2018-01-03 NOTE — Addendum Note (Signed)
Addended by: Asencion PartridgeANDY, Nattie Lazenby on: 01/03/2018 11:59 AM   Modules accepted: Orders

## 2018-01-04 ENCOUNTER — Ambulatory Visit
Admission: RE | Admit: 2018-01-04 | Discharge: 2018-01-04 | Disposition: A | Discharge status: Home / Self Care | Payer: BLUE CROSS/BLUE SHIELD | Source: Ambulatory Visit | Attending: Family Medicine | Admitting: Family Medicine

## 2018-01-04 DIAGNOSIS — N6341 Unspecified lump in right breast, subareolar: Secondary | ICD-10-CM

## 2018-01-05 ENCOUNTER — Ambulatory Visit: Payer: BLUE CROSS/BLUE SHIELD | Admitting: Endocrinology

## 2018-01-05 ENCOUNTER — Other Ambulatory Visit: Payer: Self-pay | Admitting: Family Medicine

## 2018-01-05 DIAGNOSIS — Z139 Encounter for screening, unspecified: Secondary | ICD-10-CM

## 2018-02-02 ENCOUNTER — Ambulatory Visit: Payer: BLUE CROSS/BLUE SHIELD | Admitting: Endocrinology

## 2018-03-14 NOTE — Progress Notes (Addendum)
Patient ID: Christine Guzman, female   DOB: 1956/06/26, 62 y.o.   MRN: [id number]            REFERRING provider: Billey Chang MD  Chief complaint: Adrenal insufficiency   History of Present Illness:   The patient was diagnosed to have adrenal insufficiency in 2009 when she was having symptoms with unexpected weight loss, markedly decreased appetite, weakness, fatigue and probably low blood pressure She was diagnosed to have secondary adrenal insufficiency at [facility] but no details available She does not think she had any problems with low thyroid function and was not having any issues with irregular menstrual cycles at that time   No records of this evaluation not available She was treated with probably 20 mg Cortef in the morning and 10 mg in the evening with resolution of her symptoms and she did well for several years after this  She thinks that about 3 years ago in [place] her endocrinologist felt that her hydrocortisone dose was excessively even though she did not have any symptoms of weight gain, or swelling of the face, weakness.  She was told that because of her blood test results she needs to reduce her Cortef down to 15 mg a day She currently takes 10 mg in the morning and 5 mg in the evening at 8 PM  For at least 2 months now she has symptoms of feeling weak and lightheaded at times She also has some nausea, decreased appetite and some more fatigue However has not lost any weight Last sodium was normal  Problem 2: DIABETES type II   Sugar 200 in 12/18 1- tch Was Prandin byetta vomit  Mean values apply above for all meters except median for One Touch  PRE-MEAL Fasting Lunch Dinner Bedtime Overall  Glucose range:  ? 50    Mean/median: 120       POST-MEAL PC Breakfast PC Lunch PC Dinner  Glucose range:     Mean/median:   250     Diet yrs 6x Salad lunch  Met abd pain  Diagnosis date:  1990s  Previous history:  She has mostly been treated with  metformin Previously she also had been given treatments including Prandin, Actos and Byetta Previous history is not available She thinks she had vomiting with Byetta and did not continue She was taken off Actos because of fear of bladder cancer and not clear of Prandin was effective  Recent history:     Oral hypoglycemic drugs: Metformin ER, 2 tablets twice daily       Side effects from medications:  Abdominal pain from 4 tablets of metformin  Her A1c is 7.1 currently in December 2018 was 7.6        CURRENT management, blood sugar patterns and problems identified:   Patient was on 500 mg twice daily of metformin ER in December when her A1c went up to 7.6  She thinks at that time her blood sugars were running around 200 and on her own she started taking twice as much metformin  However she continues to have abdominal discomfort when she takes this  She monitors her blood sugar reportedly at least 3 times a day  She thinks her blood sugars are about 120 in the morning  Blood sugars are usually higher later in the day but she does not remember how much but HIGHEST blood sugars are probably after supper  She also thinks that between lunch and supper she may sometimes feel weak and shaky  and her blood sugar may be in the 50s  Monitors blood glucose: ?  3 times a day.    Glucometer: One Touch.           Blood Glucose readings by recall as above  Meals:  Usually 3 meals per day.          Physical activity: exercise: Minimal because of back pain            Dietician visit: Most recent:   A couple of years ago, has seen the dietitian on and off about 6 times in the last few years   Weight control:  Wt Readings from Last 3 Encounters:  03/15/18 165 lb (74.8 kg)  01/02/18 169 lb 9.6 oz (76.9 kg)  11/11/17 164 lb 9.6 oz (74.7 kg)    Lab Results  Component Value Date   HGBA1C 7.1 01/02/2018   HGBA1C 7.6 (H) 09/16/2017   Lab Results  Component Value Date   MICROALBUR 1.1  10/10/2017   CREATININE 0.63 01/02/2018     Past Medical History:  Diagnosis Date  . Adrenal insufficiency ([place])   . Ankylosing spondylitis ([place]) 09/16/2017  . CAD (coronary artery disease), native coronary artery 09/16/2017   LVH, nuclear stress test, no MI or cardiac cath  . Diabetes ([place])   . Diabetic retinopathy associated with type 2 diabetes mellitus ([place]) 09/26/2017  . Diverticulosis   . Fibromyalgia   . Gallstone   . GERD (gastroesophageal reflux disease)   . Kidney stone   . Memory impairment 09/16/2017   Neurologist in [facility]  . Osteopenia after menopause 09/16/2017  . PVC (premature ventricular contraction)    mild MR and trivial AR/TR on echo 2009  . Sleep apnea   . Spinal stenosis    disc disease in the C-spine, T-spine, L-spine, nerve damage (pain management  . Spinal stenosis, lumbar 09/16/2017   Chronic pain mgt  . Thyroid nodule   . Vitamin D deficiency     Past Surgical History:  Procedure Laterality Date  . ABDOMINAL HYSTERECTOMY  07/2000  . CESAREAN SECTION     x 4  . COLON SURGERY  09/2015   mass [place]  . LITHOTRIPSY    . TONSILLECTOMY    . TUBAL LIGATION      Family History  Problem Relation Age of Onset  . Liver cancer Mother   . Diabetes Mother   . Lung cancer Father   . Stroke Brother   . Heart disease Brother   . Nephrolithiasis Daughter   . Polycystic ovary syndrome Daughter   . Stroke Son   . Hypertension Son   . Heart disease Daughter   . Polycystic ovary syndrome Daughter   . Diabetes Daughter   . Cirrhosis Son     Social History:  reports that she has never smoked. She has never used smokeless tobacco. She reports that she does not drink alcohol or use drugs.  Allergies:  Allergies  Allergen Reactions  . Amoxicillin Rash  . Sulfa Antibiotics Rash    Allergies as of 03/15/2018      Reactions   Amoxicillin Rash   Sulfa Antibiotics Rash      Medication List        Accurate as of  03/15/18  2:29 PM. Always use your most recent med list.          aspirin 81 MG tablet Take 81 mg by mouth daily.   clobetasol cream 0.05 % Commonly known  as:  TEMOVATE Apply 1 application topically 2 (two) times daily.   CVS CITRATE OF MAGNESIA Soln Generic drug:  magnesium citrate Take by mouth.   esomeprazole 40 MG capsule Commonly known as:  NEXIUM Take 1 capsule (40 mg total) by mouth 2 (two) times daily before a meal.   estradiol 0.1 MG/GM vaginal cream Commonly known as:  ESTRACE Place 1 Applicatorful vaginally 3 (three) times a week.   hydrocortisone 5 MG tablet Commonly known as:  CORTEF Take 2 tablets (10 mg total) by mouth every morning AND 1 tablet (5 mg total) at bedtime.   ketoconazole 2 % cream Commonly known as:  NIZORAL Apply 1 application topically daily.   metFORMIN 500 MG 24 hr tablet Commonly known as:  GLUCOPHAGE-XR Take 2 tablets (1,000 mg total) by mouth daily with breakfast.   morphine 60 MG 24 hr capsule Commonly known as:  KADIAN TAKE ONE CAPSULE BY MOUTH EVERY 12 HOURS FOR SEVERE SPIINE PAIN   ONETOUCH DELICA LANCETS 24M Misc USE TO CHECK BLOOD SUGAR TWICE A DAY AND PRN   ONETOUCH VERIO test strip Generic drug:  glucose blood USE TO CHECK BLOOD SUGARS TWICE A DAY AND AS NEEDED **DX CODE E11.9   ONETOUCH VERIO w/Device Kit 1 kit by Does not apply route 2 (two) times daily as needed.   rosuvastatin 40 MG tablet Commonly known as:  CRESTOR Take 1 tablet (40 mg total) by mouth at bedtime.   Vitamin D (Ergocalciferol) 50000 units Caps capsule Commonly known as:  DRISDOL Take 50,000 Units by mouth 2 (two) times a week.       LABS:  No visits with results within 1 Week(s) from this visit.  Latest known visit with results is:  Office Visit on 01/02/2018  Component Date Value Ref Range Status  . Hemoglobin A1C 01/02/2018 7.1   Final  . Sodium 01/02/2018 143  135 - 145 mEq/L Final  . Potassium 01/02/2018 4.0  3.5 - 5.1 mEq/L Final    . Chloride 01/02/2018 103  96 - 112 mEq/L Final  . CO2 01/02/2018 33* 19 - 32 mEq/L Final  . Glucose, Bld 01/02/2018 112* 70 - 99 mg/dL Final  . BUN 01/02/2018 9  6 - 23 mg/dL Final  . Creatinine, Ser 01/02/2018 0.63  0.40 - 1.20 mg/dL Final  . Total Bilirubin 01/02/2018 0.4  0.2 - 1.2 mg/dL Final  . Alkaline Phosphatase 01/02/2018 54  39 - 117 U/L Final  . AST 01/02/2018 20  0 - 37 U/L Final  . ALT 01/02/2018 18  0 - 35 U/L Final  . Total Protein 01/02/2018 6.7  6.0 - 8.3 g/dL Final  . Albumin 01/02/2018 3.8  3.5 - 5.2 g/dL Final  . Calcium 01/02/2018 9.3  8.4 - 10.5 mg/dL Final  . GFR 01/02/2018 101.26  >60.00 mL/min Final  . Cholesterol 01/02/2018 259* 0 - 200 mg/dL Final   ATP III Classification       Desirable:  < 200 mg/dL               Borderline High:  200 - 239 mg/dL          High:  > = 240 mg/dL  . Triglycerides 01/02/2018 239.0* 0.0 - 149.0 mg/dL Final   Normal:  <150 mg/dLBorderline High:  150 - 199 mg/dL  . HDL 01/02/2018 49.30  >39.00 mg/dL Final  . VLDL 01/02/2018 47.8* 0.0 - 40.0 mg/dL Final  . Total CHOL/HDL Ratio 01/02/2018 5   Final  Men          Women1/2 Average Risk     3.4          3.3Average Risk          5.0          4.42X Average Risk          9.6          7.13X Average Risk          15.0          11.0                      . NonHDL 01/02/2018 209.85   Final   NOTE:  Non-HDL goal should be 30 mg/dL higher than patient's LDL goal (i.e. LDL goal of < 70 mg/dL, would have non-HDL goal of < 100 mg/dL)  . Direct LDL 01/02/2018 180.0  mg/dL Final   Optimal:  <100 mg/dLNear or Above Optimal:  100-129 mg/dLBorderline High:  130-159 mg/dLHigh:  160-189 mg/dLVery High:  >190 mg/dL          Review of Systems  Constitutional: Positive for weight gain and reduced appetite.       Decreased appetite for about 2 months  HENT: Positive for headaches.        Headaches in the morning for the last couple of years  Eyes: Negative for blurred vision.   Respiratory: Positive for daytime sleepiness. Negative for shortness of breath.   Cardiovascular: Negative for leg swelling.  Gastrointestinal: Positive for nausea and constipation.       Has severe constipation which is present for several years and treated with milk of magnesia regularly  Endocrine: Positive for fatigue and light-headedness.  Musculoskeletal: Positive for back pain.  Skin: Negative for rash.  Neurological: Positive for tingling. Negative for numbness.       She has tingling in her legs but mostly on one side on the left  Psychiatric/Behavioral: Negative for insomnia.     PHYSICAL EXAM:  BP 128/70 (BP Location: Left Arm, Patient Position: Standing, Cuff Size: Normal)   Pulse 80   Ht 5' 1" (1.549 m)   Wt 165 lb (74.8 kg)   SpO2 98%   BMI 31.18 kg/m   GENERAL: Averagely built and nourished  No cushingoid features No pallor, clubbing, cervical lymphadenopathy or edema.  Skin:  no rash or pigmentation.  No hirsutism  EYES:  Externally normal.  Fundii:  normal discs and vessels.  No changes of retinopathy seen  ENT: Oral mucosa and tongue normal.  THYROID:  Not palpable.  HEART:  Normal  S1 and S2; no murmur or click.  CHEST:  Normal shape.  Lungs: Vescicular breath sounds heard equally.  No crepitations/ wheeze.  ABDOMEN:  No distention.  Liver and spleen not palpable.  No other mass or tenderness.  NEUROLOGICAL: .Reflexes are bilaterally normal at biceps, absent of the left ankle  Vibration sense normal in the toes  Diabetic Foot Exam - Simple   Simple Foot Form Diabetic Foot exam was performed with the following findings:  Yes 03/15/2018  3:02 PM  Visual Inspection No deformities, no ulcerations, no other skin breakdown bilaterally:  Yes Sensation Testing Intact to touch and monofilament testing bilaterally:  Yes Pulse Check Posterior Tibialis and Dorsalis pulse intact bilaterally:  Yes Comments      JOINTS:  Normal peripheral  joints.   ASSESSMENT:    Secondary adrenal insufficiency She has had a previous  history of adrenal insufficiency diagnosed at [facility] and had responded very well to hydrocortisone Not clear why the dose was reduced about 3 years ago and the patient did not appear to have had any signs of cortisol excess at that time Apparently no other hypopituitarism issues were found and was not told that she had any pituitary tumor  She is at present taking only 15 mg of hydrocortisone with the second dose late in the evening Currently she does appear to have some symptoms of lightheadedness, decreased appetite and nausea that may be related to adrenal insufficiency although she does not appear to have orthostatic hypotension or hyponatremia    Diabetes type  2 with mild obesity and BMI 31  Currently on metformin only using 2 g daily However she has abdominal discomfort with this and not able to tolerate this well She is reporting tendency to high postprandial readings especially after supper and also she thinks she has hypoglycemia symptoms with blood sugars in the 50s in the afternoons despite eating relatively low carbohydrate meal at lunchtime Overall she is familiar with the diabetic diet and is usually following this Currently not able to exercise because of chronic back pain  HYPERTENSION: Well controlled with multiple medications  HYPERCHOLESTEROLEMIA treated with Crestor and followed by PCP, to have follow-up labs with PCP, baseline LDL was 180 in April    PLAN:    Increase hydrocortisone to physiological doses of 20 mg in the morning and 10 mg in the late afternoon.  This should help her symptoms of decreased appetite, lightheadedness and weakness.  Also inadequate hydrocortisone dose may be playing a role in her symptoms of hypoglycemia at times in the afternoons  For her diabetes since she is not able to tolerate high doses of metformin without abdominal pain she will switch to  Janumet XR 100/1000 mg daily.  This should help postprandial control and will be better tolerated. Also she does need to bring her monitor for download on each visit Since she is intolerant to GLP-1 drugs this may be the first option but if not well controlled may consider adding an SGLT2 drug  FOLLOW-up to be done in 6 weeks and will reassess her control with fructosamine  Elayne Snare 03/15/2018, 2:29 PM

## 2018-03-15 ENCOUNTER — Encounter: Payer: Self-pay | Admitting: Endocrinology

## 2018-03-15 ENCOUNTER — Ambulatory Visit: Payer: BLUE CROSS/BLUE SHIELD | Admitting: Endocrinology

## 2018-03-15 VITALS — BP 150/80 | HR 80 | Ht 61.0 in | Wt 165.0 lb

## 2018-03-15 DIAGNOSIS — E161 Other hypoglycemia: Secondary | ICD-10-CM | POA: Diagnosis not present

## 2018-03-15 DIAGNOSIS — R531 Weakness: Secondary | ICD-10-CM | POA: Diagnosis not present

## 2018-03-15 DIAGNOSIS — E1165 Type 2 diabetes mellitus with hyperglycemia: Secondary | ICD-10-CM

## 2018-03-15 DIAGNOSIS — E2749 Other adrenocortical insufficiency: Secondary | ICD-10-CM | POA: Diagnosis not present

## 2018-03-15 MED ORDER — SITAGLIP PHOS-METFORMIN HCL ER 100-1000 MG PO TB24: 1.0000 | ORAL_TABLET | Freq: Every day | ORAL | 2 refills | Status: DC

## 2018-03-15 MED ORDER — HYDROCORTISONE 10 MG PO TABS: ORAL_TABLET | ORAL | 3 refills | Status: DC

## 2018-03-27 ENCOUNTER — Ambulatory Visit: Payer: BLUE CROSS/BLUE SHIELD | Admitting: Family Medicine

## 2018-03-27 ENCOUNTER — Other Ambulatory Visit: Payer: Self-pay

## 2018-03-27 ENCOUNTER — Encounter: Payer: Self-pay | Admitting: Family Medicine

## 2018-03-27 VITALS — BP 148/96 | HR 83 | Temp 98.3°F | Resp 16 | Ht 61.0 in | Wt 163.0 lb

## 2018-03-27 DIAGNOSIS — E118 Type 2 diabetes mellitus with unspecified complications: Secondary | ICD-10-CM

## 2018-03-27 DIAGNOSIS — M797 Fibromyalgia: Secondary | ICD-10-CM | POA: Diagnosis not present

## 2018-03-27 DIAGNOSIS — F1120 Opioid dependence, uncomplicated: Secondary | ICD-10-CM

## 2018-03-27 DIAGNOSIS — I10 Essential (primary) hypertension: Secondary | ICD-10-CM | POA: Diagnosis not present

## 2018-03-27 DIAGNOSIS — R413 Other amnesia: Secondary | ICD-10-CM

## 2018-03-27 DIAGNOSIS — R5383 Other fatigue: Secondary | ICD-10-CM | POA: Diagnosis not present

## 2018-03-27 DIAGNOSIS — E1165 Type 2 diabetes mellitus with hyperglycemia: Secondary | ICD-10-CM

## 2018-03-27 DIAGNOSIS — IMO0002 Reserved for concepts with insufficient information to code with codable children: Secondary | ICD-10-CM

## 2018-03-27 DIAGNOSIS — H3530 Unspecified macular degeneration: Secondary | ICD-10-CM | POA: Insufficient documentation

## 2018-03-27 DIAGNOSIS — R51 Headache: Secondary | ICD-10-CM

## 2018-03-27 DIAGNOSIS — R519 Headache, unspecified: Secondary | ICD-10-CM

## 2018-03-27 LAB — COMPREHENSIVE METABOLIC PANEL
ALT: 17 U/L (ref 0–35)
AST: 17 U/L (ref 0–37)
Albumin: 4.4 g/dL (ref 3.5–5.2)
Alkaline Phosphatase: 66 U/L (ref 39–117)
BUN: 11 mg/dL (ref 6–23)
CHLORIDE: 101 meq/L (ref 96–112)
CO2: 29 mEq/L (ref 19–32)
Calcium: 10 mg/dL (ref 8.4–10.5)
Creatinine, Ser: 0.7 mg/dL (ref 0.40–1.20)
GFR: 89.6 mL/min (ref >60.00)
Glucose, Bld: 114 mg/dL — ABNORMAL HIGH (ref 70–99)
POTASSIUM: 4.3 meq/L (ref 3.5–5.1)
SODIUM: 141 meq/L (ref 135–145)
Total Bilirubin: 0.4 mg/dL (ref 0.2–1.2)
Total Protein: 7.3 g/dL (ref 6.0–8.3)

## 2018-03-27 LAB — CBC WITH DIFFERENTIAL/PLATELET
BASOS PCT: 1.2 % (ref 0.0–3.0)
Basophils Absolute: 0.1 10*3/uL (ref 0.0–0.1)
EOS PCT: 0.3 % (ref 0.0–5.0)
Eosinophils Absolute: 0 10*3/uL (ref 0.0–0.7)
HCT: 42.2 % (ref 36.0–46.0)
HEMOGLOBIN: 14 g/dL (ref 12.0–15.0)
LYMPHS ABS: 2.8 10*3/uL (ref 0.7–4.0)
LYMPHS PCT: 27.5 % (ref 12.0–46.0)
MCHC: 33.3 g/dL (ref 30.0–36.0)
MCV: 83.6 fl (ref 78.0–100.0)
MONO ABS: 0.6 10*3/uL (ref 0.1–1.0)
MONOS PCT: 5.9 % (ref 3.0–12.0)
NEUTROS ABS: 6.5 10*3/uL (ref 1.4–7.7)
Neutrophils Relative %: 65.1 % (ref 43.0–77.0)
PLATELETS: 272 10*3/uL (ref 150.0–400.0)
RBC: 5.04 Mil/uL (ref 3.87–5.11)
RDW: 14.6 % (ref 11.5–15.5)
WBC: 10 10*3/uL (ref 4.0–10.5)

## 2018-03-27 LAB — LIPID PANEL
CHOLESTEROL: 291 mg/dL — AB (ref 0–200)
HDL: 55.3 mg/dL (ref >39.00)
NonHDL: 235.51
TRIGLYCERIDES: 223 mg/dL — AB (ref 0.0–149.0)
Total CHOL/HDL Ratio: 5
VLDL: 44.6 mg/dL — AB (ref 0.0–40.0)

## 2018-03-27 LAB — POCT GLYCOSYLATED HEMOGLOBIN (HGB A1C): Hemoglobin A1C: 6.6 % — AB (ref 4.0–5.6)

## 2018-03-27 LAB — LDL CHOLESTEROL, DIRECT: LDL DIRECT: 196 mg/dL

## 2018-03-27 LAB — TSH: TSH: 1.28 u[IU]/mL (ref 0.35–4.50)

## 2018-03-27 MED ORDER — LISINOPRIL 5 MG PO TABS: 5.0000 mg | ORAL_TABLET | Freq: Every day | ORAL | 3 refills | Status: DC

## 2018-03-27 MED ORDER — DONEPEZIL HCL 10 MG PO TABS: 10.0000 mg | ORAL_TABLET | Freq: Every day | ORAL | 1 refills | Status: DC

## 2018-03-27 NOTE — Progress Notes (Signed)
Subjective  CC:  Chief Complaint  Patient presents with  . Fatigue    Has had fatige for several months, also having high blood pressure, husband wants her to have a refill for Donazapril from Dr. Charlynne Pander, needs referral to Neurology    HPI: Christine Guzman is a 62 y.o. female who presents to the office today to address the problems listed above in the chief complaint.  Pt report she doesn't feel well. Reports feeling exhausted. Having central headaches that persist over several months. Vague historian.   ROS: + headache, photophobia, nausea w/o vomiting, no paresis; + chronic pain unchanged, poor sleep.   Reviewed endocrinology notes: started on janumet recently. Increased steroids for adrenal insufficiency.   On crestor 40 for uncontrolled HLD, due for recheck, nonfasting  Reports h/o "dementia" and was on aricept but ran out. Requests refills. She saw a neurologist but those records are not available for review.   Assessment  1. Fatigue, unspecified type   2. Diabetes mellitus type 2, uncontrolled, with complications ([place])   3. Fibromyalgia   4. Essential hypertension   5. Memory impairment   6. Chronic narcotic dependence (HCC)   7. Persistent headaches      Plan   fatigue:  Unclear etiology and likely multifactorial: check labs.   DM: improved control. Per endocrine  Fibro and chronic pain on narcotics per pain mgt  Memory impairment with headaches: restart aricept per request and refer to neuro.  HLD: recheck lipids on high dose crestor.   Follow up: Return in about 2 weeks (around 04/10/2018) for result review.   Orders Placed This Encounter  Procedures  . TSH  . CBC with Differential/Platelet  . Comprehensive metabolic panel  . Ambulatory referral to Neurology  . POCT glycosylated hemoglobin (Hb A1C)   Meds ordered this encounter  Medications  . lisinopril (PRINIVIL,ZESTRIL) 5 MG tablet    Sig: Take 1 tablet (5 mg total) by mouth daily.    Dispense:   90 tablet    Refill:  3  . donepezil (ARICEPT) 10 MG tablet    Sig: Take 1 tablet (10 mg total) by mouth at bedtime.    Dispense:  90 tablet    Refill:  1      I reviewed the patients updated PMH, FH, and SocHx.    Patient Active Problem List   Diagnosis Date Noted  . Diabetic retinopathy associated with type 2 diabetes mellitus ([place]) 09/26/2017    Priority: High  . Spinal stenosis, lumbar 09/16/2017    Priority: High  . Ankylosing spondylitis ([place]) 09/16/2017    Priority: High  . Therapeutic opioid induced constipation 09/16/2017    Priority: High  . Diabetes mellitus type 2, uncontrolled, with complications ([place]) 85/63/1497    Priority: High  . Fibromyalgia 09/16/2017    Priority: High  . Hyperlipidemia 09/16/2017    Priority: High  . Obstructive sleep apnea syndrome 09/16/2017    Priority: High  . Chronic narcotic dependence ([place]) 01/28/2014    Priority: High  . Nontoxic multinodular goiter 09/26/2017    Priority: Medium  . Osteoarthritis 09/16/2017    Priority: Medium  . Osteopenia after menopause 09/16/2017    Priority: Medium  . Memory impairment 09/16/2017    Priority: Medium  . GERD (gastroesophageal reflux disease) 09/16/2017    Priority: Medium  . Fatty liver 09/16/2017    Priority: Medium  . Erosive lichen planus of vulva 09/16/2017    Priority: Low  . Rectal mass - chronic  fecal impaction 09/16/2017    Priority: Low  . Nephrolithiasis 09/16/2017    Priority: Low  . Gallstone 09/16/2017    Priority: Low  . Chest pain, non-cardiac 09/16/2017    Priority: Low  . Macular degeneration 03/27/2018  . Essential hypertension 03/27/2018  . Secondary adrenal insufficiency ([place]) 03/15/2018  . Vitamin D deficiency 09/26/2017   Current Meds  Medication Sig  . aspirin 81 MG tablet Take 81 mg by mouth daily.  . Blood Glucose Monitoring Suppl (ONETOUCH VERIO) w/Device KIT 1 kit by Does not apply route 2 (two) times daily as needed.  . clobetasol cream  (TEMOVATE) 7.82 % Apply 1 application topically 2 (two) times daily.  Marland Kitchen esomeprazole (NEXIUM) 40 MG capsule Take 1 capsule (40 mg total) by mouth 2 (two) times daily before a meal.  . estradiol (ESTRACE) 0.1 MG/GM vaginal cream Place 1 Applicatorful vaginally 3 (three) times a week.  . hydrocortisone (CORTEF) 10 MG tablet 2 in am and 1 at 5 pm  . magnesium citrate (CVS CITRATE OF MAGNESIA) SOLN Take by mouth.  . morphine (KADIAN) 60 MG 24 hr capsule TAKE ONE CAPSULE BY MOUTH EVERY 12 HOURS FOR SEVERE SPIINE PAIN  . ONETOUCH DELICA LANCETS 95A MISC USE TO CHECK BLOOD SUGAR TWICE A DAY AND PRN  . ONETOUCH VERIO test strip USE TO CHECK BLOOD SUGARS TWICE A DAY AND AS NEEDED **DX CODE E11.9  . rosuvastatin (CRESTOR) 40 MG tablet Take 1 tablet (40 mg total) by mouth at bedtime.  . SitaGLIPtin-MetFORMIN HCl (JANUMET XR) [phone] MG TB24 Take 1 tablet by mouth daily with breakfast.  . Vitamin D, Ergocalciferol, (DRISDOL) 50000 UNITS CAPS capsule Take 50,000 Units by mouth 2 (two) times a week.     Allergies: Patient is allergic to amoxicillin and sulfa antibiotics. Family History: Patient family history includes Cirrhosis in her son; Diabetes in her daughter and mother; Heart disease in her brother and daughter; Hypertension in her son; Liver cancer in her mother; Lung cancer in her father; Nephrolithiasis in her daughter; Polycystic ovary syndrome in her daughter and daughter; Stroke in her brother and son. Social History:  Patient  reports that she has never smoked. She has never used smokeless tobacco. She reports that she does not drink alcohol or use drugs.  Review of Systems: Constitutional: Negative for fever malaise or anorexia Cardiovascular: negative for chest pain Respiratory: negative for SOB or persistent cough Gastrointestinal: negative for abdominal pain  Objective  Vitals: BP (!) 148/96   Pulse 83   Temp 98.3 F (36.8 C) (Oral)   Resp 16   Ht '5\' 1"'$  (1.549 m)   Wt 163 lb  (73.9 kg)   SpO2 95%   BMI 30.80 kg/m  General: no acute distress , A&Ox3, moves slowly HEENT: PEERL, conjunctiva normal, Oropharynx moist,neck is supple Cardiovascular:  RRR without murmur or gallop.  Respiratory:  Good breath sounds bilaterally, CTAB with normal respiratory effort Skin:  Warm, no rashes     Commons side effects, risks, benefits, and alternatives for medications and treatment plan prescribed today were discussed, and the patient expressed understanding of the given instructions. Patient is instructed to call or message via MyChart if he/she has any questions or concerns regarding our treatment plan. No barriers to understanding were identified. We discussed Red Flag symptoms and signs in detail. Patient expressed understanding regarding what to do in case of urgent or emergency type symptoms.   Medication list was reconciled, printed and provided to the patient in  AVS. Patient instructions and summary information was reviewed with the patient as documented in the AVS. This note was prepared with assistance of Dragon voice recognition software. Occasional wrong-word or sound-a-like substitutions may have occurred due to the inherent limitations of voice recognition software

## 2018-03-27 NOTE — Patient Instructions (Addendum)
Please return in 1-2 weeks to review results and recheck your blood pressure.   Please start taking lisinopril 5mg  daily.  You may restart your donepezil and I have placed a referral to neurology to check your headaches and memory concerns.    If you have any questions or concerns, please don't hesitate to send me a message via MyChart or call the office at [phone]. Thank you for visiting with Christine Guzman today! It's our pleasure caring for you.

## 2018-03-28 ENCOUNTER — Telehealth: Payer: Self-pay | Admitting: Family Medicine

## 2018-03-28 NOTE — Telephone Encounter (Signed)
Returning call.

## 2018-03-28 NOTE — Telephone Encounter (Signed)
Copied from CRM [id number]. Topic: Quick Communication - Lab Results >> Mar 28, 2018  3:51 PM Fagge, Sinda DuMegan L, CMA wrote: Called patient to inform them of lab results. When patient returns call, triage nurse may disclose results.  Please call patient: lab test do not reveal any cause for fatigue or abnormalities to explain her symptoms.  Cholesterol levels however are worsening. She reports she is taking her crestor, but this would not be consistent with that. Can she check her bottles to and bring in the crestor bottle for confirmation (and pill count); make sure there is not an error in her medication? And/or call pharmacy to see if she picked up the increased dose of crestor?

## 2018-03-29 NOTE — Telephone Encounter (Signed)
Pt given results on 6/25. See result note

## 2018-03-30 ENCOUNTER — Telehealth: Payer: Self-pay | Admitting: Neurology

## 2018-03-30 ENCOUNTER — Encounter: Payer: Self-pay | Admitting: Neurology

## 2018-03-30 ENCOUNTER — Ambulatory Visit: Payer: BLUE CROSS/BLUE SHIELD | Admitting: Neurology

## 2018-03-30 VITALS — BP 158/86 | HR 70 | Ht 61.0 in | Wt 164.0 lb

## 2018-03-30 DIAGNOSIS — F0390 Unspecified dementia without behavioral disturbance: Secondary | ICD-10-CM

## 2018-03-30 DIAGNOSIS — G931 Anoxic brain damage, not elsewhere classified: Secondary | ICD-10-CM | POA: Diagnosis not present

## 2018-03-30 DIAGNOSIS — R413 Other amnesia: Secondary | ICD-10-CM

## 2018-03-30 MED ORDER — MEMANTINE HCL 10 MG PO TABS: 10.0000 mg | ORAL_TABLET | Freq: Two times a day (BID) | ORAL | 11 refills | Status: DC

## 2018-03-30 NOTE — Telephone Encounter (Signed)
[employer] Auth: [id number] (exp. 03/30/18 to 04/28/18) order sent to GI. They will reach out to the pt to schedule.

## 2018-03-30 NOTE — Progress Notes (Signed)
PATIENT: Christine Guzman DOB: 1956-06-18  Chief Complaint  Patient presents with  . New Patient (Initial Visit)    PCP: Dr. Billey Chang. Patient is here alone.   . Memory Loss    MMSE 18/30, Animals: 5     HISTORICAL  Christine Guzman is a 62 year old female, seen in refer by her primary care doctor Leamon Arnt, for evaluation of memory loss, initial evaluation was on March 30, 2018,  She was brought in by her neighbor, but is alone at today's clinical visit.  66 year old husband, I try to reach her husband, patient stated that he does not use phone.  Her husband send a note of her medical history.  She had a history of kidney stone surgery under general anesthesia 2001, she suffered hypoxic brain injury, collapse of both lung postsurgically per husband, stating ICU, requiring high volume oxygen treatment.  She was [facility] later [facility], diagnosis with dementia since 2008, was put on donepezil 10 mg daily, she moved from [facility] to [place] since April 2019, she used to work as a Radio producer, disabled since her hypoxic brain injury in 2001, she also had a history of diabetes was treated since 1998, the patient suffered frequent hypoglycemia episode, also significant side effect of medications, there was some changes of her diabetic medications,  Patient is only able to provide it limited history, per note from her husband, patient struggled to find the right word, forgot to turn off the water, turn off the lights, misplace items, needs to be reminded to eat,  Laboratory evaluation in June 2019 showed elevated triglyceride 223, total cholesterol 291, normal CMP, CBC hemoglobin of 14, TSH 1.28, A1c of 6.6, LDL 180, A1c 7.1, vitamin D 82  REVIEW OF SYSTEMS: Full 14 system review of systems performed and notable only for memory loss, confusion, headaches, weakness, dizziness, constipation, blurred vision,  fatigue  ALLERGIES: Allergies  Allergen Reactions  . Amoxicillin Rash  . Sulfa Antibiotics Rash    HOME MEDICATIONS: Current Outpatient Medications  Medication Sig Dispense Refill  . aspirin 81 MG tablet Take 81 mg by mouth daily.    . Blood Glucose Monitoring Suppl (ONETOUCH VERIO) w/Device KIT 1 kit by Does not apply route 2 (two) times daily as needed. 1 kit 0  . clobetasol cream (TEMOVATE) 7.42 % Apply 1 application topically 2 (two) times daily. 30 g 0  . donepezil (ARICEPT) 10 MG tablet Take 1 tablet (10 mg total) by mouth at bedtime. 90 tablet 1  . esomeprazole (NEXIUM) 40 MG capsule Take 1 capsule (40 mg total) by mouth 2 (two) times daily before a meal. 180 capsule 3  . estradiol (ESTRACE) 0.1 MG/GM vaginal cream Place 1 Applicatorful vaginally 3 (three) times a week. 42.5 g 1  . hydrocortisone (CORTEF) 10 MG tablet 2 in am and 1 at 5 pm 90 tablet 3  . lisinopril (PRINIVIL,ZESTRIL) 5 MG tablet Take 1 tablet (5 mg total) by mouth daily. 90 tablet 3  . magnesium citrate (CVS CITRATE OF MAGNESIA) SOLN Take by mouth.    . morphine (KADIAN) 60 MG 24 hr capsule TAKE ONE CAPSULE BY MOUTH EVERY 12 HOURS FOR SEVERE SPIINE PAIN  0  . ONETOUCH DELICA LANCETS 59D MISC USE TO CHECK BLOOD SUGAR TWICE A DAY AND PRN 200 each 2  . ONETOUCH VERIO test strip USE TO CHECK BLOOD SUGARS TWICE A DAY AND AS NEEDED **DX CODE E11.9 100 each  3  . rosuvastatin (CRESTOR) 40 MG tablet Take 1 tablet (40 mg total) by mouth at bedtime. 90 tablet 1  . SitaGLIPtin-MetFORMIN HCl (JANUMET XR) [phone] MG TB24 Take 1 tablet by mouth daily with breakfast. 30 tablet 2  . Vitamin D, Ergocalciferol, (DRISDOL) 50000 UNITS CAPS capsule Take 50,000 Units by mouth 2 (two) times a week.      No current facility-administered medications for this visit.     PAST MEDICAL HISTORY: Past Medical History:  Diagnosis Date  . Adrenal insufficiency ([place])   . Ankylosing spondylitis ([place]) 09/16/2017  . CAD (coronary artery  disease), native coronary artery 09/16/2017   LVH, nuclear stress test, no MI or cardiac cath  . Diabetes ([place])   . Diabetic retinopathy associated with type 2 diabetes mellitus ([place]) 09/26/2017  . Diverticulosis   . Fibromyalgia   . Gallstone   . GERD (gastroesophageal reflux disease)   . Kidney stone   . Memory impairment 09/16/2017   Neurologist in [facility]  . Osteopenia after menopause 09/16/2017  . PVC (premature ventricular contraction)    mild MR and trivial AR/TR on echo 2009  . Sleep apnea   . Spinal stenosis    disc disease in the C-spine, T-spine, L-spine, nerve damage (pain management  . Spinal stenosis, lumbar 09/16/2017   Chronic pain mgt  . Thyroid nodule   . Vitamin D deficiency     PAST SURGICAL HISTORY: Past Surgical History:  Procedure Laterality Date  . ABDOMINAL HYSTERECTOMY  07/2000  . CESAREAN SECTION     x 4  . COLON SURGERY  09/2015   mass [place]  . LITHOTRIPSY    . TONSILLECTOMY    . TUBAL LIGATION      FAMILY HISTORY: Family History  Problem Relation Age of Onset  . Liver cancer Mother   . Diabetes Mother   . Lung cancer Father   . Stroke Brother   . Heart disease Brother   . Nephrolithiasis Daughter   . Polycystic ovary syndrome Daughter   . Stroke Son   . Hypertension Son   . Heart disease Daughter   . Polycystic ovary syndrome Daughter   . Diabetes Daughter   . Cirrhosis Son     SOCIAL HISTORY:  Social History   Socioeconomic History  . Marital status: Married    Spouse name: Christine Guzman  . Number of children: 4  . Years of education: Not on file  . Highest education level: Not on file  Occupational History  . Occupation: disabled, pain  Social Needs  . Financial resource strain: Hard  . Food insecurity:    Worry: Not on file    Inability: Not on file  . Transportation needs:    Medical: Not on file    Non-medical: Not on file  Tobacco Use  . Smoking status: Never Smoker  . Smokeless  tobacco: Never Used  Substance and Sexual Activity  . Alcohol use: No    Frequency: Never  . Drug use: No  . Sexual activity: Never  Lifestyle  . Physical activity:    Days per week: Not on file    Minutes per session: Not on file  . Stress: Not on file  Relationships  . Social connections:    Talks on phone: Not on file    Gets together: Not on file    Attends religious service: Not on file    Active member of club or organization: Not on file    Attends  meetings of clubs or organizations: Not on file    Relationship status: Not on file  . Intimate partner violence:    Fear of current or ex partner: Not on file    Emotionally abused: Not on file    Physically abused: Not on file    Forced sexual activity: Not on file  Other Topics Concern  . Not on file  Social History Narrative  . Not on file     PHYSICAL EXAM   Vitals:   03/30/18 1008  BP: (!) 158/86  Pulse: 70  Weight: 164 lb (74.4 kg)  Height: '5\' 1"'$  (1.549 m)    Not recorded      Body mass index is 30.99 kg/m.  PHYSICAL EXAMNIATION:  Gen: NAD, conversant, well nourised, obese, well groomed                     Cardiovascular: Regular rate rhythm, no peripheral edema, warm, nontender. Eyes: Conjunctivae clear without exudates or hemorrhage Neck: Supple, no carotid bruits. Pulmonary: Clear to auscultation bilaterally   NEUROLOGICAL EXAM:  MMSE - Mini Mental State Exam 03/30/2018  Orientation to time 3  Orientation to Place 3  Registration 2  Attention/ Calculation 3  Recall 2  Language- name 2 objects 2  Language- repeat 0  Language- follow 3 step command 3  Language- read & follow direction 0  Write a sentence 0  Copy design 0  Total score 18  animal naming 5   CRANIAL NERVES: CN II: Visual fields are full to confrontation. Fundoscopic exam is normal with sharp discs and no vascular changes. Pupils are round equal and briskly reactive to light. CN III, IV, VI: extraocular movement are  normal. No ptosis. CN V: Facial sensation is intact to pinprick in all 3 divisions bilaterally. Corneal responses are intact.  CN VII: Face is symmetric with normal eye closure and smile. CN VIII: Hearing is normal to rubbing fingers CN IX, X: Palate elevates symmetrically. Phonation is normal. CN XI: Head turning and shoulder shrug are intact CN XII: Tongue is midline with normal movements and no atrophy.  MOTOR: There is no pronator drift of out-stretched arms. Muscle bulk and tone are normal. Muscle strength is normal.  REFLEXES: Reflexes are 2+ and symmetric at the biceps, triceps, knees, and ankles. Plantar responses are flexor.  SENSORY: Intact to light touch, pinprick, positional sensation and vibratory sensation are intact in fingers and toes.  COORDINATION: Rapid alternating movements and fine finger movements are intact. There is no dysmetria on finger-to-nose and heel-knee-shin.    GAIT/STANCE: Posture is normal. Gait is steady with normal steps   DIAGNOSTIC DATA (LABS, IMAGING, TESTING) - I reviewed patient records, labs, notes, testing and imaging myself where available.   ASSESSMENT AND PLAN  Christine Guzman is a 62 y.o. female    Dementia History of hypoxic brain injury in 2001    Mini-Mental Status Examination is 18/30  Complete evaluation with MRI of the brain without contrast.  I have advised him to come back to clinic with her husband or friend at next visit, will check B12 level,  Add on namenda '10mg'$  bid.  Marcial Pacas, M.D. Ph.D.  [facility] [address] [place], [place] [address] Ph: [phone] Fax: [phone]  CC: Leamon Arnt, MD

## 2018-04-03 ENCOUNTER — Ambulatory Visit: Payer: BLUE CROSS/BLUE SHIELD | Admitting: Family Medicine

## 2018-04-22 ENCOUNTER — Other Ambulatory Visit: Payer: Self-pay | Admitting: Neurology

## 2018-04-24 ENCOUNTER — Other Ambulatory Visit: Payer: BLUE CROSS/BLUE SHIELD

## 2018-05-02 ENCOUNTER — Other Ambulatory Visit: Payer: BLUE CROSS/BLUE SHIELD

## 2018-05-08 ENCOUNTER — Ambulatory Visit: Payer: BLUE CROSS/BLUE SHIELD | Admitting: Endocrinology

## 2018-05-16 ENCOUNTER — Ambulatory Visit: Payer: BLUE CROSS/BLUE SHIELD | Admitting: Endocrinology

## 2018-05-29 ENCOUNTER — Other Ambulatory Visit: Payer: BLUE CROSS/BLUE SHIELD

## 2018-06-01 ENCOUNTER — Ambulatory Visit: Payer: BLUE CROSS/BLUE SHIELD | Admitting: Endocrinology

## 2018-06-09 ENCOUNTER — Other Ambulatory Visit: Payer: Self-pay | Admitting: Endocrinology

## 2018-06-09 NOTE — Telephone Encounter (Signed)
Refuse with note to make appointment 

## 2018-06-09 NOTE — Telephone Encounter (Signed)
Is this okay to refill? Pt has only had one appointment and canceled all followups

## 2018-06-12 ENCOUNTER — Telehealth: Payer: Self-pay | Admitting: Physician Assistant

## 2018-06-12 ENCOUNTER — Other Ambulatory Visit: Payer: Self-pay | Admitting: *Deleted

## 2018-06-12 MED ORDER — ESOMEPRAZOLE MAGNESIUM 40 MG PO CPDR: 40.0000 mg | DELAYED_RELEASE_CAPSULE | Freq: Two times a day (BID) | ORAL | 2 refills | Status: AC

## 2018-06-12 NOTE — Telephone Encounter (Signed)
LM for the patient advising I did send # 60 with 2 refills of the esomeprazole ( Nexium) 40 mg.  [facility] [address] , [place].

## 2018-07-05 ENCOUNTER — Telehealth: Payer: Self-pay | Admitting: Neurology

## 2018-07-05 NOTE — Telephone Encounter (Signed)
error 

## 2018-07-06 ENCOUNTER — Ambulatory Visit: Payer: BLUE CROSS/BLUE SHIELD | Admitting: Neurology

## 2018-09-28 ENCOUNTER — Ambulatory Visit: Payer: BLUE CROSS/BLUE SHIELD | Admitting: Physician Assistant

## 2018-09-28 ENCOUNTER — Encounter: Payer: Self-pay | Admitting: Physician Assistant

## 2018-09-28 ENCOUNTER — Other Ambulatory Visit: Payer: Self-pay

## 2018-09-28 VITALS — BP 148/88 | HR 76 | Temp 98.4°F | Resp 16 | Ht 61.0 in | Wt 165.4 lb

## 2018-09-28 DIAGNOSIS — I10 Essential (primary) hypertension: Secondary | ICD-10-CM | POA: Diagnosis not present

## 2018-09-28 DIAGNOSIS — E118 Type 2 diabetes mellitus with unspecified complications: Secondary | ICD-10-CM

## 2018-09-28 DIAGNOSIS — J019 Acute sinusitis, unspecified: Secondary | ICD-10-CM | POA: Diagnosis not present

## 2018-09-28 DIAGNOSIS — B9689 Other specified bacterial agents as the cause of diseases classified elsewhere: Secondary | ICD-10-CM | POA: Diagnosis not present

## 2018-09-28 LAB — POCT GLYCOSYLATED HEMOGLOBIN (HGB A1C): Hemoglobin A1C: 7.4 % — AB (ref 4.0–5.6)

## 2018-09-28 MED ORDER — SITAGLIP PHOS-METFORMIN HCL ER 100-1000 MG PO TB24: 1.0000 | ORAL_TABLET | Freq: Every day | ORAL | 0 refills | Status: DC

## 2018-09-28 MED ORDER — LISINOPRIL 5 MG PO TABS: 5.0000 mg | ORAL_TABLET | Freq: Every day | ORAL | 0 refills | Status: DC

## 2018-09-28 MED ORDER — LEVOFLOXACIN 500 MG PO TABS: 500.0000 mg | ORAL_TABLET | Freq: Every day | ORAL | 0 refills | Status: DC

## 2018-09-28 NOTE — Patient Instructions (Signed)
Please restart your Lisinopril and diabetes medications.  I have sent in to the pharmacy for you. This is only a one-month supply. You need to schedule a follow-up with Dr. Mardelle MatteAndy in 1-2 weeks for reassessment.   Please take antibiotic as directed.  Increase fluid intake.  Use Saline nasal spray.  Take a daily multivitamin.  Place a humidifier in the bedroom.  Please call or return clinic if symptoms are not improving.  Sinusitis Sinusitis is redness, soreness, and swelling (inflammation) of the paranasal sinuses. Paranasal sinuses are air pockets within the bones of your face (beneath the eyes, the middle of the forehead, or above the eyes). In healthy paranasal sinuses, mucus is able to drain out, and air is able to circulate through them by way of your nose. However, when your paranasal sinuses are inflamed, mucus and air can become trapped. This can allow bacteria and other germs to grow and cause infection. Sinusitis can develop quickly and last only a short time (acute) or continue over a long period (chronic). Sinusitis that lasts for more than 12 weeks is considered chronic.  CAUSES  Causes of sinusitis include:  Allergies.  Structural abnormalities, such as displacement of the cartilage that separates your nostrils (deviated septum), which can decrease the air flow through your nose and sinuses and affect sinus drainage.  Functional abnormalities, such as when the small hairs (cilia) that line your sinuses and help remove mucus do not work properly or are not present. SYMPTOMS  Symptoms of acute and chronic sinusitis are the same. The primary symptoms are pain and pressure around the affected sinuses. Other symptoms include:  Upper toothache.  Earache.  Headache.  Bad breath.  Decreased sense of smell and taste.  A cough, which worsens when you are lying flat.  Fatigue.  Fever.  Thick drainage from your nose, which often is green and may contain pus (purulent).  Swelling  and warmth over the affected sinuses. DIAGNOSIS  Your caregiver will perform a physical exam. During the exam, your caregiver may:  Look in your nose for signs of abnormal growths in your nostrils (nasal polyps).  Tap over the affected sinus to check for signs of infection.  View the inside of your sinuses (endoscopy) with a special imaging device with a light attached (endoscope), which is inserted into your sinuses. If your caregiver suspects that you have chronic sinusitis, one or more of the following tests may be recommended:  Allergy tests.  Nasal culture A sample of mucus is taken from your nose and sent to a lab and screened for bacteria.  Nasal cytology A sample of mucus is taken from your nose and examined by your caregiver to determine if your sinusitis is related to an allergy. TREATMENT  Most cases of acute sinusitis are related to a viral infection and will resolve on their own within 10 days. Sometimes medicines are prescribed to help relieve symptoms (pain medicine, decongestants, nasal steroid sprays, or saline sprays).  However, for sinusitis related to a bacterial infection, your caregiver will prescribe antibiotic medicines. These are medicines that will help kill the bacteria causing the infection.  Rarely, sinusitis is caused by a fungal infection. In theses cases, your caregiver will prescribe antifungal medicine. For some cases of chronic sinusitis, surgery is needed. Generally, these are cases in which sinusitis recurs more than 3 times per year, despite other treatments. HOME CARE INSTRUCTIONS   Drink plenty of water. Water helps thin the mucus so your sinuses can drain more  easily.  Use a humidifier.  Inhale steam 3 to 4 times a day (for example, sit in the bathroom with the shower running).  Apply a warm, moist washcloth to your face 3 to 4 times a day, or as directed by your caregiver.  Use saline nasal sprays to help moisten and clean your sinuses.  Take  over-the-counter or prescription medicines for pain, discomfort, or fever only as directed by your caregiver. SEEK IMMEDIATE MEDICAL CARE IF:  You have increasing pain or severe headaches.  You have nausea, vomiting, or drowsiness.  You have swelling around your face.  You have vision problems.  You have a stiff neck.  You have difficulty breathing. MAKE SURE YOU:   Understand these instructions.  Will watch your condition.  Will get help right away if you are not doing well or get worse. Document Released: 09/20/2005 Document Revised: 12/13/2011 Document Reviewed: 10/05/2011 [facility] Patient Information 2014 [employer], [place].

## 2018-09-28 NOTE — Progress Notes (Signed)
Patient presents to clinic today with multiple complaints.   Patient with history of hypertension, on a regimen of lisinopril 5 mg daily, endorses significantly elevated BP yesterday with BP max at 190/100. Notes mild nausea at the time and mild headache but denies chest pain, palpitations, lightheadedness or dizziness. Notes she has been keeping a low salt diet. Notes she has been out of medication for 3 weeks or so. Is requesting refill of medication. She is overdue for follow-up with her PCP.  Patient also with history of DM II, previously controlled but with diabetic retinopathy. Is currently on a regimen of Janumet XR [phone] once daily, shown prescribed by Dr. Dwyane Dee. Patient endorses she has been out of medication for > 3 months. Was told that Dr. Jonni Sanger would take over management of diabetic medications for her but has not given refills of medicine. She says she cannot remember if she requested a refill of her medicine via [place], phone, etc.  Last A1C < 7. Notes glucose levels have been as high as 300 but have been averaging in 200s.  Lastly, patient endorses 5-6 weeks of sinus pressure, sinus pain, mostly maxillary with headaches, cough and voice hoarseness. Notes some ear pressure and pain.  States that she has a history of sinusitis and only Levaquin works for her.    Past Medical History:  Diagnosis Date  . Adrenal insufficiency ([place])   . Ankylosing spondylitis ([place]) 09/16/2017  . CAD (coronary artery disease), native coronary artery 09/16/2017   LVH, nuclear stress test, no MI or cardiac cath  . Diabetes ([place])   . Diabetic retinopathy associated with type 2 diabetes mellitus ([place]) 09/26/2017  . Diverticulosis   . Fibromyalgia   . Gallstone   . GERD (gastroesophageal reflux disease)   . Kidney stone   . Memory impairment 09/16/2017   Neurologist in [facility]  . Osteopenia after menopause 09/16/2017  . PVC (premature ventricular contraction)    mild MR and trivial AR/TR on echo 2009    . Sleep apnea   . Spinal stenosis    disc disease in the C-spine, T-spine, L-spine, nerve damage (pain management  . Spinal stenosis, lumbar 09/16/2017   Chronic pain mgt  . Thyroid nodule   . Vitamin D deficiency     Current Outpatient Medications on File Prior to Visit  Medication Sig Dispense Refill  . Blood Glucose Monitoring Suppl (ONETOUCH VERIO) w/Device KIT 1 kit by Does not apply route 2 (two) times daily as needed. 1 kit 0  . clobetasol cream (TEMOVATE) 3.32 % Apply 1 application topically 2 (two) times daily. 30 g 0  . donepezil (ARICEPT) 10 MG tablet Take 1 tablet (10 mg total) by mouth at bedtime. 90 tablet 1  . esomeprazole (NEXIUM) 40 MG capsule Take 1 capsule (40 mg total) by mouth 2 (two) times daily before a meal. 60 capsule 2  . lisinopril (PRINIVIL,ZESTRIL) 5 MG tablet Take 1 tablet (5 mg total) by mouth daily. 90 tablet 3  . magnesium citrate (CVS CITRATE OF MAGNESIA) SOLN Take by mouth.    . memantine (NAMENDA) 10 MG tablet TAKE 1 TABLET BY MOUTH TWICE A DAY 180 tablet 3  . morphine (KADIAN) 60 MG 24 hr capsule TAKE ONE CAPSULE BY MOUTH EVERY 12 HOURS FOR SEVERE SPIINE PAIN  0  . ONETOUCH DELICA LANCETS 95J MISC USE TO CHECK BLOOD SUGAR TWICE A DAY AND PRN 200 each 2  . ONETOUCH VERIO test strip USE TO CHECK BLOOD SUGARS TWICE A  DAY AND AS NEEDED **DX CODE E11.9 100 each 3  . rosuvastatin (CRESTOR) 40 MG tablet Take 1 tablet (40 mg total) by mouth at bedtime. 90 tablet 1  . SitaGLIPtin-MetFORMIN HCl (JANUMET XR) [phone] MG TB24 Take 1 tablet by mouth daily with breakfast. 30 tablet 2  . Vitamin D, Ergocalciferol, (DRISDOL) 50000 UNITS CAPS capsule Take 50,000 Units by mouth 2 (two) times a week.     Marland Kitchen aspirin 81 MG tablet Take 81 mg by mouth daily.     No current facility-administered medications on file prior to visit.     Allergies  Allergen Reactions  . Amoxicillin Rash  . Sulfa Antibiotics Rash    Family History  Problem Relation Age of Onset  . Liver  cancer Mother   . Diabetes Mother   . Lung cancer Father   . Stroke Brother   . Heart disease Brother   . Nephrolithiasis Daughter   . Polycystic ovary syndrome Daughter   . Stroke Son   . Hypertension Son   . Heart disease Daughter   . Polycystic ovary syndrome Daughter   . Diabetes Daughter   . Cirrhosis Son     Social History   Socioeconomic History  . Marital status: Married    Spouse name: Delilah Shan  . Number of children: 4  . Years of education: Not on file  . Highest education level: Not on file  Occupational History  . Occupation: disabled, pain  Social Needs  . Financial resource strain: Hard  . Food insecurity:    Worry: Not on file    Inability: Not on file  . Transportation needs:    Medical: Not on file    Non-medical: Not on file  Tobacco Use  . Smoking status: Never Smoker  . Smokeless tobacco: Never Used  Substance and Sexual Activity  . Alcohol use: No    Frequency: Never  . Drug use: No  . Sexual activity: Never  Lifestyle  . Physical activity:    Days per week: Not on file    Minutes per session: Not on file  . Stress: Not on file  Relationships  . Social connections:    Talks on phone: Not on file    Gets together: Not on file    Attends religious service: Not on file    Active member of club or organization: Not on file    Attends meetings of clubs or organizations: Not on file    Relationship status: Not on file  Other Topics Concern  . Not on file  Social History Narrative  . Not on file   Review of Systems - See HPI.  All other ROS are negative.  BP (!) 148/88 (BP Location: Left Arm)   Pulse 76   Temp 98.4 F (36.9 C) (Oral)   Resp 16   Ht _0  (1.549 m)   Wt 165 lb 6.4 oz (75 kg)   SpO2 98%   BMI 31.25 kg/m   Physical Exam Vitals signs reviewed.  Constitutional:      Appearance: Normal appearance.  HENT:     Head: Normocephalic and atraumatic.     Right Ear: Tympanic membrane normal.     Left Ear: Tympanic membrane  normal.     Nose: Mucosal edema, congestion and rhinorrhea present.     Right Sinus: Maxillary sinus tenderness present.     Left Sinus: Maxillary sinus tenderness and frontal sinus tenderness present.  Neck:     Musculoskeletal: Neck supple.  Cardiovascular:     Rate and Rhythm: Normal rate and regular rhythm.     Pulses: Normal pulses.     Heart sounds: Normal heart sounds.  Neurological:     Mental Status: She is alert.      Assessment/Plan: 1. Acute bacterial sinusitis Rx Levaquin.  Increase fluids.  Rest.  Saline nasal spray.  Probiotic.  Mucinex as directed.  Humidifier in bedroom.  Call or return to clinic if symptoms are not improving.  - levofloxacin (LEVAQUIN) 500 MG tablet; Take 1 tablet (500 mg total) by mouth daily.  Dispense: 7 tablet; Refill: 0  2. Essential hypertension Out of medication. Asymptomatic. BP mildly elevated today. Will restart lisinopril. Start DASH diet. Follow-up 1 week with PCP for reassessment and further management.  - lisinopril (PRINIVIL,ZESTRIL) 5 MG tablet; Take 1 tablet (5 mg total) by mouth daily.  Dispense: 30 tablet; Refill: 0  3. Controlled type 2 diabetes mellitus with complication, without long-term current use of insulin (HCC) Lab Results  Component Value Date   HGBA1C 7.4 (A) 09/28/2018  Mild increase but out of medication x 3 months. Restart Janumet XR at prior dose. Follow up 1 week with PCP for reassessment of glucose levels and to discuss ongoing management.  - SitaGLIPtin-MetFORMIN HCl (JANUMET XR) [phone] MG TB24; Take 1 tablet by mouth daily with breakfast.  Dispense: 30 tablet; Refill: 0 - POCT glycosylated hemoglobin (Hb A1C)   Leeanne Rio, PA-C

## 2018-10-02 ENCOUNTER — Other Ambulatory Visit: Payer: Self-pay

## 2018-10-02 ENCOUNTER — Ambulatory Visit: Payer: BLUE CROSS/BLUE SHIELD | Admitting: Family Medicine

## 2018-10-02 ENCOUNTER — Encounter: Payer: Self-pay | Admitting: Family Medicine

## 2018-10-02 VITALS — BP 138/82 | HR 73 | Temp 98.1°F | Resp 16 | Ht 61.0 in | Wt 165.4 lb

## 2018-10-02 DIAGNOSIS — I10 Essential (primary) hypertension: Secondary | ICD-10-CM

## 2018-10-02 DIAGNOSIS — E118 Type 2 diabetes mellitus with unspecified complications: Secondary | ICD-10-CM | POA: Diagnosis not present

## 2018-10-02 DIAGNOSIS — F0390 Unspecified dementia without behavioral disturbance: Secondary | ICD-10-CM

## 2018-10-02 DIAGNOSIS — F1120 Opioid dependence, uncomplicated: Secondary | ICD-10-CM

## 2018-10-02 DIAGNOSIS — E782 Mixed hyperlipidemia: Secondary | ICD-10-CM | POA: Diagnosis not present

## 2018-10-02 DIAGNOSIS — B9689 Other specified bacterial agents as the cause of diseases classified elsewhere: Secondary | ICD-10-CM

## 2018-10-02 DIAGNOSIS — J019 Acute sinusitis, unspecified: Secondary | ICD-10-CM

## 2018-10-02 MED ORDER — SITAGLIP PHOS-METFORMIN HCL ER 100-1000 MG PO TB24: 1.0000 | ORAL_TABLET | Freq: Every day | ORAL | 0 refills | Status: DC

## 2018-10-02 MED ORDER — LISINOPRIL 5 MG PO TABS: 5.0000 mg | ORAL_TABLET | Freq: Every day | ORAL | 3 refills | Status: AC

## 2018-10-02 MED ORDER — FLUTICASONE PROPIONATE 50 MCG/ACT NA SUSP: 2.0000 | Freq: Every day | NASAL | 6 refills | Status: DC

## 2018-10-02 MED ORDER — GUAIFENESIN-CODEINE 100-10 MG/5ML PO SOLN: 5.0000 mL | Freq: Four times a day (QID) | ORAL | 0 refills | Status: DC | PRN

## 2018-10-02 MED ORDER — LEVOFLOXACIN 500 MG PO TABS: 500.0000 mg | ORAL_TABLET | Freq: Every day | ORAL | 0 refills | Status: DC

## 2018-10-02 NOTE — Patient Instructions (Addendum)
Please return to see me every 3 months.  Please schedule the next 2 appointments for her.  Please have your husband come with you to all of your medical appointments. This is necessary due to your problems with your memory and thinking.  I recommend trying to decrease your pain medication to help with your thinking if possible.   Please schedule follow up visits with Dr. Lucianne MussKumar (endocrinology) and Dr. Terrace Arabiayan (neurology) for follow up. Dr. Lucianne MussKumar is managing your diabetes; you will get your refills from him. Dr. Terrace ArabiaYan is managing your dementia and medications as well. You are overdue to see them both.   I have referred your to the lipid disorder clinic to help manage your cholesterol. You may need Repatha or an injectable medication like that.  I have ordered more antibiotics for your sinus infection.   If you have any questions or concerns, please don't hesitate to send me a message via MyChart or call the office at [phone]. Thank you for visiting with us today! It's our pleasure caring for you.

## 2018-10-02 NOTE — Progress Notes (Signed)
Subjective  CC:  Chief Complaint  Patient presents with  . Hypertension    HPI: Rut Betterton is a 62 y.o. female who presents to the office today for follow up of diabetes and follow-up for recent visit where she was diagnosed with acute sinusitis.  She also had uncontrolled hypertension at that time due to being out of her blood pressure medications.  Diabetes follow up: Her diabetic control is reported as Worse.  As noted last week, she was out of her diabetes medication.  She had been referred to endocrinology for management of her diabetes.  Unfortunately, she has dementia.  She reports that her husband has been helping her with medications.  I have never met him yet.  She reports fatigue. She denies exertional CP or SOB or symptomatic hypoglycemia. She denies foot sores or paresthesias.   Uncontrolled hypertension: Restarted ACE inhibitor and blood pressure looks better today.  She does report that a nurse from her insurance company is coming out to help her manage her medications.  Dementia: She was to have an MRI and follow-up with neurology but this never got done.  She is on Namenda and Aricept.  Her Mini-Mental status from June was 18 out of 30.  She has history of hypoxic brain injury.  Chronic pain on chronic high-dose narcotics per pain management  Secondary adrenal insufficiency: This should be managed by endocrinology as well.  Sinus infection: Reports history of chronic sinusitis needing 3 weeks of antibiotics in the past by ENT.  Has been on Levaquin for 1 week and not feeling much better.  Hyperlipidemia unresponsive to statins.  No history of CAD  Assessment  1. Essential hypertension   2. Acute bacterial sinusitis   3. Controlled type 2 diabetes mellitus with complication, without long-term current use of insulin ([place])   4. Mixed hyperlipidemia   5. Chronic narcotic dependence ([place])   6. Dementia without behavioral disturbance, unspecified dementia type ([place])        Plan   Diabetes is currently marginally controlled.  Now that she is back on medicine should get under control again.  Follow-up with endocrinology.  Education given.  History of chronic sinusitis: Levaquin for 3 weeks.  If not improved, will refer back to ENT  Hypertension: Now controlled on low-dose ACE inhibitor.  Recommend every 3 month follow-up.  Mixed hyperlipidemia, familial: Recommend referral to lipid disorder clinic for management  Dementia on chronic narcotics: Recommend decreasing narcotic use if possible.  Discussed with patient need for help with managing her medical problems and medications.  Recommend to always come with her husband for all of her visits.  See AVS for education regarding follow-up recommendations.  She does need follow-up with neurology and endocrinology.  Follow up: Return in about 3 months (around 01/01/2019) for recheck.. Orders Placed This Encounter  Procedures  . AMB Referral to [facility] ordered this encounter  Medications  . levofloxacin (LEVAQUIN) 500 MG tablet    Sig: Take 1 tablet (500 mg total) by mouth daily.    Dispense:  14 tablet    Refill:  0  . fluticasone (FLONASE) 50 MCG/ACT nasal spray    Sig: Place 2 sprays into both nostrils daily.    Dispense:  16 g    Refill:  6  . guaiFENesin-codeine 100-10 MG/5ML syrup    Sig: Take 5 mLs by mouth every 6 (six) hours as needed for cough.    Dispense:  120 mL  Refill:  0  . SitaGLIPtin-MetFORMIN HCl (JANUMET XR) [phone] MG TB24    Sig: Take 1 tablet by mouth daily with breakfast.    Dispense:  90 tablet    Refill:  0  . lisinopril (PRINIVIL,ZESTRIL) 5 MG tablet    Sig: Take 1 tablet (5 mg total) by mouth daily.    Dispense:  90 tablet    Refill:  3      Immunization History  Administered Date(s) Administered  . Pneumococcal Polysaccharide-23 10/10/2017  . Tdap 10/10/2017    Diabetes Related Lab Review: Lab Results  Component Value Date    HGBA1C 7.4 (A) 09/28/2018   HGBA1C 6.6 (A) 03/27/2018   HGBA1C 7.1 01/02/2018    Lab Results  Component Value Date   MICROALBUR 1.1 10/10/2017   Lab Results  Component Value Date   CREATININE 0.70 03/27/2018   BUN 11 03/27/2018   NA 141 03/27/2018   K 4.3 03/27/2018   CL 101 03/27/2018   CO2 29 03/27/2018   Lab Results  Component Value Date   CHOL 291 (H) 03/27/2018   CHOL 259 (H) 01/02/2018   CHOL 241 (H) 09/16/2017   Lab Results  Component Value Date   HDL 55.30 03/27/2018   HDL 49.30 01/02/2018   HDL 53.10 09/16/2017   No results found for: [facility] Lab Results  Component Value Date   TRIG 223.0 (H) 03/27/2018   TRIG 239.0 (H) 01/02/2018   TRIG 270.0 (H) 09/16/2017   Lab Results  Component Value Date   CHOLHDL 5 03/27/2018   CHOLHDL 5 01/02/2018   CHOLHDL 5 09/16/2017   Lab Results  Component Value Date   LDLDIRECT 196.0 03/27/2018   LDLDIRECT 180.0 01/02/2018   LDLDIRECT 181.0 09/16/2017   The 10-year ASCVD risk score Mikey Bussing DC Jr., et al., 2013) is: 17.7%   Values used to calculate the score:     Age: 13 years     Sex: Female     Is Non-Hispanic African American: No     Diabetic: Yes     Tobacco smoker: No     Systolic Blood Pressure: 947 mmHg     Is BP treated: Yes     HDL Cholesterol: 55.3 mg/dL     Total Cholesterol: 291 mg/dL I have reviewed the PMH, Fam and Soc history. Patient Active Problem List   Diagnosis Date Noted  . Hypoxic brain injury ([place]) 03/30/2018    Priority: High  . Dementia without behavioral disturbance ([place]) 03/30/2018    Priority: High  . Essential hypertension 03/27/2018    Priority: High  . Secondary adrenal insufficiency ([place]) 03/15/2018    Priority: High  . Diabetic retinopathy associated with type 2 diabetes mellitus ([place]) 09/26/2017    Priority: High    Noted in endocrinology notes.    Marland Kitchen Spinal stenosis, lumbar 09/16/2017    Priority: High    Chronic pain mgt   . Ankylosing spondylitis ([place]) 09/16/2017     Priority: High  . Therapeutic opioid induced constipation 09/16/2017    Priority: High  . Diabetes mellitus type 2, uncontrolled, with complications ([place]) 09/62/8366    Priority: High    Did not tolerate byetta or januvia   . Fibromyalgia 09/16/2017    Priority: High  . Hyperlipidemia 09/16/2017    Priority: High  . Obstructive sleep apnea syndrome 09/16/2017    Priority: High  . Chronic narcotic dependence ([place]) 01/28/2014    Priority: High  . Macular degeneration 03/27/2018    Priority:  Medium  . Nontoxic multinodular goiter 09/26/2017    Priority: Medium  . Osteoarthritis 09/16/2017    Priority: Medium  . Osteopenia after menopause 09/16/2017    Priority: Medium    DEXA 05/2015 T = - 1.1 spine, - 2.3 hip, FRZX 17/2.7%;    Marland Kitchen GERD (gastroesophageal reflux disease) 09/16/2017    Priority: Medium  . Fatty liver 09/16/2017    Priority: Medium  . Vitamin D deficiency 09/26/2017    Priority: Low  . Erosive lichen planus of vulva 09/16/2017    Priority: Low  . Rectal mass - chronic fecal impaction 09/16/2017    Priority: Low    2015 surgically removed;   Marland Kitchen Nephrolithiasis 09/16/2017    Priority: Low  . Gallstone 09/16/2017    Priority: Low  . Chest pain, non-cardiac 09/16/2017    Priority: Low    Lexiscan perfusion feb 2015: nl perfusion. No ischemia. EF 68%; Echocardiogram august 2017: nl LV size and thickness, EF 62-26%, mild diastolic dysfunction no MI or cardiac cath     Social History: Patient  reports that she has never smoked. She has never used smokeless tobacco. She reports that she does not drink alcohol or use drugs.  Review of Systems: Ophthalmic: negative for eye pain, loss of vision or double vision Cardiovascular: negative for chest pain Respiratory: negative for SOB or persistent cough Gastrointestinal: negative for abdominal pain Genitourinary: negative for dysuria or gross hematuria MSK: negative for foot lesions Neurologic: negative for weakness  or gait disturbance  Objective  Vitals: BP 138/82   Pulse 73   Temp 98.1 F (36.7 C) (Oral)   Resp 16   Ht '5\' 1"'$  (1.549 m)   Wt 165 lb 6.4 oz (75 kg)   SpO2 97%   BMI 31.25 kg/m  General: well appearing, no acute distress pleasant Psych:  Alert and oriented, normal mood and affect HEENT:  Normocephalic, atraumatic, moist mucous membranes, supple neck, bilateral sinus tenderness present with mild swelling Cardiovascular:  Nl S1 and S2, RRR without murmur, gallop or rub. no edema Respiratory:  Good breath sounds bilaterally, CTAB with normal effort, no rales Skin:  Warm, no rashes Neurologic:   Mental status is normal. normal gait     Diabetic education: ongoing education regarding chronic disease management for diabetes was given today. We continue to reinforce the ABC's of diabetic management: A1c (<7 or 8 dependent upon patient), tight blood pressure control, and cholesterol management with goal LDL < 100 minimally. We discuss diet strategies, exercise recommendations, medication options and possible side effects. At each visit, we review recommended immunizations and preventive care recommendations for diabetics and stress that good diabetic control can prevent other problems. See below for this patient's data.    Commons side effects, risks, benefits, and alternatives for medications and treatment plan prescribed today were discussed, and the patient expressed understanding of the given instructions. Patient is instructed to call or message via MyChart if he/she has any questions or concerns regarding our treatment plan. No barriers to understanding were identified. We discussed Red Flag symptoms and signs in detail. Patient expressed understanding regarding what to do in case of urgent or emergency type symptoms.   Medication list was reconciled, printed and provided to the patient in AVS. Patient instructions and summary information was reviewed with the patient as documented in  the AVS. This note was prepared with assistance of Dragon voice recognition software. Occasional wrong-word or sound-a-like substitutions may have occurred due to the inherent limitations of voice  recognition software

## 2018-10-03 ENCOUNTER — Telehealth: Payer: Self-pay | Admitting: Internal Medicine

## 2018-10-03 NOTE — Telephone Encounter (Signed)
Called patient and LVM to call and schedule appt. °

## 2018-10-05 ENCOUNTER — Telehealth: Payer: Self-pay | Admitting: Family Medicine

## 2018-10-05 DIAGNOSIS — M797 Fibromyalgia: Secondary | ICD-10-CM

## 2018-10-05 DIAGNOSIS — M459 Ankylosing spondylitis of unspecified sites in spine: Secondary | ICD-10-CM

## 2018-10-05 DIAGNOSIS — M48061 Spinal stenosis, lumbar region without neurogenic claudication: Secondary | ICD-10-CM

## 2018-10-05 NOTE — Telephone Encounter (Signed)
Copied from CRM [id number]. Topic: Referral - Request for Referral >> Oct 05, 2018  2:12 PM Maia Petties wrote: Has patient seen PCP for this complaint? Yes *If NO, is insurance requiring patient see PCP for this issue before PCP can refer them? Referral for which specialty: Pain Management Preferred provider/office: [facility]  [address] [place], [place] [address] Phone # [phone] Fax # [phone] Reason for referral: spinal stenosis & other spinal abnormalities  Pt notes she has been pt at [facility] with Dr. Thyra Breed and will be getting the records from their office tomorrow. [facility] (Dr. Mayford Knife) office will not schedule her without referral from PCP. Please advise.

## 2018-10-05 NOTE — Telephone Encounter (Signed)
Please advise if referral can be made

## 2018-10-05 NOTE — Telephone Encounter (Signed)
Why does she want to change.  Ok to refer.

## 2018-10-06 NOTE — Telephone Encounter (Signed)
Called pt to make her aware that referral has been made and she should be hearing back soon with appointment time and date.

## 2019-01-22 ENCOUNTER — Other Ambulatory Visit: Payer: Self-pay | Admitting: Family Medicine

## 2019-01-22 DIAGNOSIS — E118 Type 2 diabetes mellitus with unspecified complications: Secondary | ICD-10-CM

## 2019-01-22 NOTE — Telephone Encounter (Signed)
Pt due for OV; please schedule virtual visit.

## 2019-01-22 NOTE — Telephone Encounter (Signed)
LMOVM OV needed

## 2019-01-22 NOTE — Telephone Encounter (Signed)
Can this be refilled or does pt need VV?  

## 2019-06-05 ENCOUNTER — Other Ambulatory Visit: Payer: Self-pay | Admitting: *Deleted

## 2019-06-05 MED ORDER — DONEPEZIL HCL 10 MG PO TABS: 10.0000 mg | ORAL_TABLET | Freq: Every day | ORAL | 1 refills | Status: AC

## 2020-05-21 ENCOUNTER — Other Ambulatory Visit: Payer: Self-pay

## 2020-05-21 ENCOUNTER — Ambulatory Visit (INDEPENDENT_AMBULATORY_CARE_PROVIDER_SITE_OTHER): Payer: Self-pay | Admitting: Internal Medicine

## 2020-05-21 ENCOUNTER — Encounter: Payer: Self-pay | Admitting: Internal Medicine

## 2020-05-21 VITALS — BP 132/74 | HR 71 | Ht 61.0 in | Wt 168.0 lb

## 2020-05-21 DIAGNOSIS — G4733 Obstructive sleep apnea (adult) (pediatric): Secondary | ICD-10-CM

## 2020-05-21 DIAGNOSIS — F0390 Unspecified dementia without behavioral disturbance: Secondary | ICD-10-CM

## 2020-05-21 NOTE — Progress Notes (Signed)
05/21/20- 20 yoF never smoker for sleep evaluation courtesy of Dr Terrill Mohr. Medical problem list includes HTN, OSA, GERD, Adrenal Insufficiency, Goiter, DM2/ retinopathy, Hypoxic Brain Injury(2001 kidney stone surgery complicated by respiratory failure), Dementia, Osteoarthritis, Ankylosing Spondylitis, Chronic Narcotic Medication/ Pain Management, Fibromyalgia,  Body weight today- 168 Epworth score 15 - Original diagnostic study was in [place] 8-10 years ago. CPAP was helpful, but machine was lost when she moved here 8 years ago. Aware of frequent awakenings. Chronic narcotic pain management makes it hard to assess daytime sleepiness.  She thinks she snores, but husband didn't come with her. ENT surgery+ tonsils. Some history of mild asthma, but denies heart disease.  Prior to Admission medications   Medication Sig Start Date End Date Taking? Authorizing Provider  aspirin 81 MG tablet Take 81 mg by mouth daily.   Yes [provider]  Blood Glucose Monitoring Suppl (ONETOUCH VERIO) w/Device KIT 1 kit by Does not apply route 2 (two) times daily as needed. 09/22/17  Yes Leamon Arnt, MD  donepezil (ARICEPT) 10 MG tablet Take 1 tablet (10 mg total) by mouth at bedtime. 06/05/19  Yes Leamon Arnt, MD  esomeprazole (NEXIUM) 40 MG capsule Take 1 capsule (40 mg total) by mouth 2 (two) times daily before a meal. 06/12/18  Yes Esterwood, Amy S, PA-C  lisinopril (PRINIVIL,ZESTRIL) 5 MG tablet Take 1 tablet (5 mg total) by mouth daily. 10/02/18  Yes Leamon Arnt, MD  magnesium citrate ([facility] CITRATE OF MAGNESIA) SOLN Take by mouth.   Yes [provider]  memantine (NAMENDA) 10 MG tablet TAKE 1 TABLET BY MOUTH TWICE A DAY 04/24/18  Yes Marcial Pacas, MD  metFORMIN (GLUCOPHAGE) 500 MG tablet Take by mouth. 02/20/20  Yes [provider]  morphine (KADIAN) 60 MG 24 hr capsule TAKE ONE CAPSULE BY MOUTH EVERY 12 HOURS FOR SEVERE SPIINE PAIN 09/06/17  Yes [provider]   ONETOUCH DELICA LANCETS 76B MISC USE TO CHECK BLOOD SUGAR TWICE A DAY AND PRN 09/22/17  Yes Leamon Arnt, MD  [facility] VERIO test strip USE TO CHECK BLOOD SUGARS TWICE A DAY AND AS NEEDED **DX CODE E11.9 10/24/17  Yes Leamon Arnt, MD  rosuvastatin (CRESTOR) 40 MG tablet Take 1 tablet (40 mg total) by mouth at bedtime. 01/03/18  Yes Leamon Arnt, MD  Vitamin D, Ergocalciferol, (DRISDOL) 50000 UNITS CAPS capsule Take 50,000 Units by mouth 2 (two) times a week.    Yes [provider]   Past Medical History:  Diagnosis Date  . Adrenal insufficiency ([place])   . Ankylosing spondylitis ([place]) 09/16/2017  . CAD (coronary artery disease), native coronary artery 09/16/2017   LVH, nuclear stress test, no MI or cardiac cath  . Diabetes ([place])   . Diabetic retinopathy associated with type 2 diabetes mellitus ([place]) 09/26/2017  . Diverticulosis   . Fibromyalgia   . Gallstone   . GERD (gastroesophageal reflux disease)   . Kidney stone   . Memory impairment 09/16/2017   Neurologist in [facility]  . Osteopenia after menopause 09/16/2017  . PVC (premature ventricular contraction)    mild MR and trivial AR/TR on echo 2009  . Sleep apnea   . Spinal stenosis    disc disease in the C-spine, T-spine, L-spine, nerve damage (pain management  . Spinal stenosis, lumbar 09/16/2017   Chronic pain mgt  . Thyroid nodule   . Vitamin D deficiency    Past Surgical History:  Procedure Laterality Date  . ABDOMINAL HYSTERECTOMY  07/2000  . CESAREAN SECTION     x 4  . COLON SURGERY  09/2015   [facility] [place]  . LITHOTRIPSY    . TONSILLECTOMY    . TUBAL LIGATION     Family History  Problem Relation Age of Onset  . Liver cancer Mother   . Diabetes Mother   . Lung cancer Father   . Stroke Brother   . Heart disease Brother   . Nephrolithiasis Daughter   . Polycystic ovary syndrome Daughter   . Stroke Son   . Hypertension Son   . Heart disease Daughter   . Polycystic ovary  syndrome Daughter   . Diabetes Daughter   . Cirrhosis Son    Social History   Socioeconomic History  . Marital status: Married    Spouse name: Delilah Shan  . Number of children: 4  . Years of education: Not on file  . Highest education level: Not on file  Occupational History  . Occupation: disabled, pain  Tobacco Use  . Smoking status: Never Smoker  . Smokeless tobacco: Never Used  Vaping Use  . Vaping Use: Never used  Substance and Sexual Activity  . Alcohol use: No  . Drug use: No  . Sexual activity: Never  Other Topics Concern  . Not on file  Social History Narrative  . Not on file   Social Determinants of Health   Financial Resource Strain:   . Difficulty of Paying Living Expenses:   Food Insecurity:   . Worried About Charity fundraiser in the Last Year:   . Arboriculturist in the Last Year:   Transportation Needs:   . Film/video editor (Medical):   Marland Kitchen Lack of Transportation (Non-Medical):   Physical Activity:   . Days of Exercise per Week:   . Minutes of Exercise per Session:   Stress:   . Feeling of Stress :   Social Connections:   . Frequency of Communication with Friends and Family:   . Frequency of Social Gatherings with Friends and Family:   . Attends Religious Services:   . Active Member of Clubs or Organizations:   . Attends Archivist Meetings:   Marland Kitchen Marital Status:   Intimate Partner Violence:   . Fear of Current or Ex-Partner:   . Emotionally Abused:   Marland Kitchen Physically Abused:   . Sexually Abused:    ROS-see HPI   + = positive Constitutional:    weight loss, night sweats, fevers, chills, fatigue, lassitude. HEENT:    +headaches, difficulty swallowing,+ tooth/dental problems, sore throat,       sneezing, itching, ear ache, nasal congestion, post nasal drip, snoring CV:    chest pain, orthopnea, PND, swelling in lower extremities, anasarca,                                  dizziness, palpitations Resp:   +shortness of breath with  exertion or at rest.                productive cough,   non-productive cough, coughing up of blood.              change in color of mucus.  wheezing.   Skin:    rash or lesions. GI:  +  heartburn, indigestion, abdominal pain, nausea, vomiting, diarrhea,  change in bowel habits, loss of appetite GU: dysuria, change in color of urine, no urgency or frequency.   flank pain. MS:   joint pain, stiffness, decreased range of motion, back pain. Neuro-     nothing unusual Psych:  change in mood or affect.  depression or anxiety.   memory loss.  OBJ- Physical Exam General- Alert, Oriented, Affect-appropriate, Distress- none acute Skin- rash-none, lesions- none, excoriation- none Lymphadenopathy- none Head- atraumatic            Eyes- Gross vision intact, PERRLA, conjunctivae and secretions clear            Ears- Hearing, canals-normal            Nose- Clear, no-Septal dev, mucus, polyps, erosion, perforation             Throat- Mallampati IV , mucosa clear , drainage- none, tonsils-absent, + teeth Neck- flexible , trachea midline, no stridor , thyroid nl, carotid no bruit Chest - symmetrical excursion , unlabored           Heart/CV- RRR , no murmur , no gallop  , no rub, nl s1 s2                           - JVD- none , edema- none, stasis changes- none, varices- none           Lung- clear to P&A, wheeze- none, cough- none , dullness-none, rub- none           Chest wall-  Abd-  Br/ Gen/ Rectal- Not done, not indicated Extrem- cyanosis- none, clubbing, none, atrophy- none, strength- nl Neuro- grossly intact to observation

## 2020-05-21 NOTE — Assessment & Plan Note (Signed)
Not clear if this impacts her sleep. Potential overlap problems of concentration, focus and alertness.  Neurology is following

## 2020-05-21 NOTE — Patient Instructions (Signed)
Order- schedule Home Sleep Test    Dx OSA  Please call us about 2 weeks after your sleep test is done, to see if results and recommendations are ready yet. If appropriate, we may be able to start treatment before we see you next.

## 2020-05-21 NOTE — Assessment & Plan Note (Signed)
Probably still has significant OSA as discussed.  Plan- sleep study, then likely CPAP would be first choice.

## 2020-06-06 ENCOUNTER — Encounter: Payer: Self-pay | Admitting: Internal Medicine

## 2020-08-26 NOTE — Progress Notes (Deleted)
05/21/20- 1 yoF never smoker for sleep evaluation courtesy of Dr Brett Fairy. Medical problem list includes HTN, OSA, GERD, Adrenal Insufficiency, Goiter, DM2/ retinopathy, Hypoxic Brain Injury(2001 kidney stone surgery complicated by respiratory failure), Dementia, Osteoarthritis, Ankylosing Spondylitis, Chronic Narcotic Medication/ Pain Management, Fibromyalgia,  Body weight today- 168 Epworth score 15 - Original diagnostic study was in [place] 8-10 years ago. CPAP was helpful, but machine was lost when she moved here 8 years ago. Aware of frequent awakenings. Chronic narcotic pain management makes it hard to assess daytime sleepiness.  She thinks she snores, but husband didn't come with her. ENT surgery+ tonsils. Some history of mild asthma, but denies heart disease.  08/27/20- 64 yoF never smoker followed for OSA, complicated by HTN, OSA, GERD, Adrenal Insufficiency, Goiter, DM2/ retinopathy, Hypoxic Brain Injury(2001 kidney stone surgery complicated by respiratory failure), Dementia, Osteoarthritis, Ankylosing Spondylitis, Chronic Narcotic Medication/ Pain Management, Fibromyalgia, HST ordered for 05/21/20- not done Body weight today- Covid vax- Flu vax-  ROS-see HPI   + = positive Constitutional:    weight loss, night sweats, fevers, chills, fatigue, lassitude. HEENT:    +headaches, difficulty swallowing,+ tooth/dental problems, sore throat,       sneezing, itching, ear ache, nasal congestion, post nasal drip, snoring CV:    chest pain, orthopnea, PND, swelling in lower extremities, anasarca,                                  dizziness, palpitations Resp:   +shortness of breath with exertion or at rest.                productive cough,   non-productive cough, coughing up of blood.              change in color of mucus.  wheezing.   Skin:    rash or lesions. GI:  +  heartburn, indigestion, abdominal pain, nausea, vomiting, diarrhea,                 change in bowel habits, loss of  appetite GU: dysuria, change in color of urine, no urgency or frequency.   flank pain. MS:   joint pain, stiffness, decreased range of motion, back pain. Neuro-     nothing unusual Psych:  change in mood or affect.  depression or anxiety.   memory loss.  OBJ- Physical Exam General- Alert, Oriented, Affect-appropriate, Distress- none acute Skin- rash-none, lesions- none, excoriation- none Lymphadenopathy- none Head- atraumatic            Eyes- Gross vision intact, PERRLA, conjunctivae and secretions clear            Ears- Hearing, canals-normal            Nose- Clear, no-Septal dev, mucus, polyps, erosion, perforation             Throat- Mallampati IV , mucosa clear , drainage- none, tonsils-absent, + teeth Neck- flexible , trachea midline, no stridor , thyroid nl, carotid no bruit Chest - symmetrical excursion , unlabored           Heart/CV- RRR , no murmur , no gallop  , no rub, nl s1 s2                           - JVD- none , edema- none, stasis changes- none, varices- none  Lung- clear to P&A, wheeze- none, cough- none , dullness-none, rub- none           Chest wall-  Abd-  Br/ Gen/ Rectal- Not done, not indicated Extrem- cyanosis- none, clubbing, none, atrophy- none, strength- nl Neuro- grossly intact to observation

## 2020-08-27 ENCOUNTER — Ambulatory Visit: Payer: Self-pay | Admitting: Internal Medicine

## 2021-02-04 ENCOUNTER — Other Ambulatory Visit: Payer: Self-pay | Admitting: Anesthesiology

## 2021-02-04 DIAGNOSIS — M479 Spondylosis, unspecified: Secondary | ICD-10-CM

## 2021-02-15 ENCOUNTER — Other Ambulatory Visit: Payer: Self-pay

## 2021-02-17 ENCOUNTER — Other Ambulatory Visit: Payer: Self-pay

## 2021-02-22 ENCOUNTER — Other Ambulatory Visit: Payer: Self-pay

## 2021-02-22 ENCOUNTER — Ambulatory Visit
Admission: RE | Admit: 2021-02-22 | Discharge: 2021-02-22 | Disposition: A | Discharge status: Home / Self Care | Payer: Medicare (Managed Care) | Source: Ambulatory Visit | Attending: Anesthesiology | Admitting: Anesthesiology

## 2021-02-22 DIAGNOSIS — M479 Spondylosis, unspecified: Secondary | ICD-10-CM

## 2021-12-21 ENCOUNTER — Other Ambulatory Visit: Payer: Self-pay | Admitting: Internal Medicine

## 2021-12-21 DIAGNOSIS — Z1231 Encounter for screening mammogram for malignant neoplasm of breast: Secondary | ICD-10-CM

## 2021-12-21 DIAGNOSIS — Z13820 Encounter for screening for osteoporosis: Secondary | ICD-10-CM

## 2021-12-22 ENCOUNTER — Other Ambulatory Visit: Payer: Self-pay | Admitting: Internal Medicine

## 2021-12-22 DIAGNOSIS — N281 Cyst of kidney, acquired: Secondary | ICD-10-CM

## 2021-12-22 DIAGNOSIS — R102 Pelvic and perineal pain: Secondary | ICD-10-CM

## 2022-01-14 ENCOUNTER — Other Ambulatory Visit: Payer: Medicare (Managed Care)

## 2022-06-10 ENCOUNTER — Other Ambulatory Visit: Payer: Self-pay | Admitting: Otolaryngology

## 2022-06-15 ENCOUNTER — Other Ambulatory Visit: Payer: Self-pay | Admitting: Otolaryngology

## 2022-06-15 DIAGNOSIS — E041 Nontoxic single thyroid nodule: Secondary | ICD-10-CM

## 2022-07-07 ENCOUNTER — Ambulatory Visit
Admission: RE | Admit: 2022-07-07 | Discharge: 2022-07-07 | Disposition: A | Discharge status: Home / Self Care | Payer: Self-pay | Source: Ambulatory Visit | Attending: Otolaryngology | Admitting: Otolaryngology

## 2022-07-07 ENCOUNTER — Other Ambulatory Visit: Payer: Self-pay | Admitting: Otolaryngology

## 2022-07-07 DIAGNOSIS — R52 Pain, unspecified: Secondary | ICD-10-CM

## 2022-09-07 ENCOUNTER — Inpatient Hospital Stay: Admission: RE | Admit: 2022-09-07 | Payer: Medicare (Managed Care) | Source: Ambulatory Visit

## 2023-03-14 IMAGING — MR MR LUMBAR SPINE W/O CM
4 of 5 series · 25 of 48 positions shown · non-contrast
Comparison: None.

CLINICAL DATA: Low back pain extending into the buttocks
bilaterally.

EXAM:
MRI LUMBAR SPINE WITHOUT CONTRAST
TECHNIQUE: Multiplanar, multisequence MR imaging of the lumbar spine was
performed. No intravenous contrast was administered.

[Series 2: T2 · sagittal · 4.0mm · 0.53mm/px · 6 of 16 slices shown (1 of 2)]
[im 1/16]
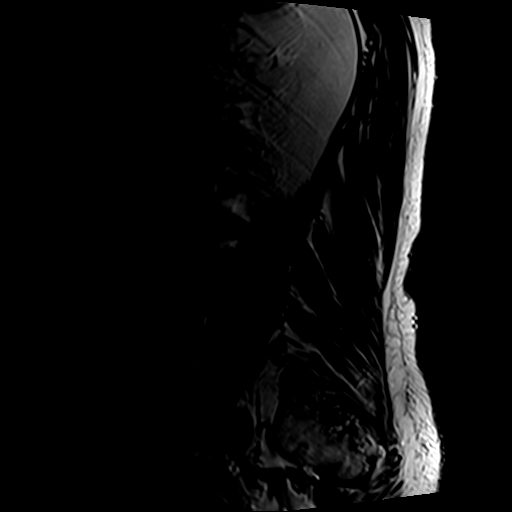
[im 4/16]
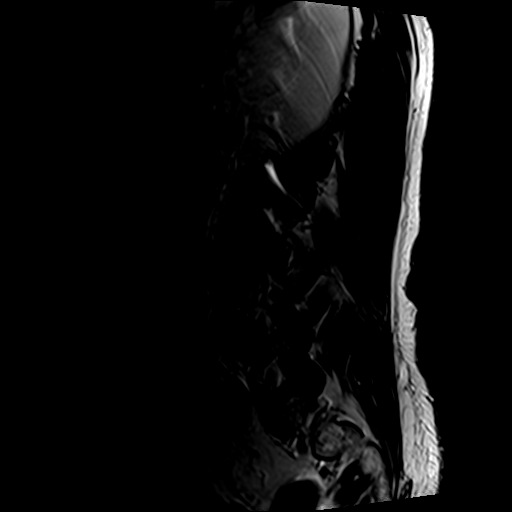
[im 7/16]
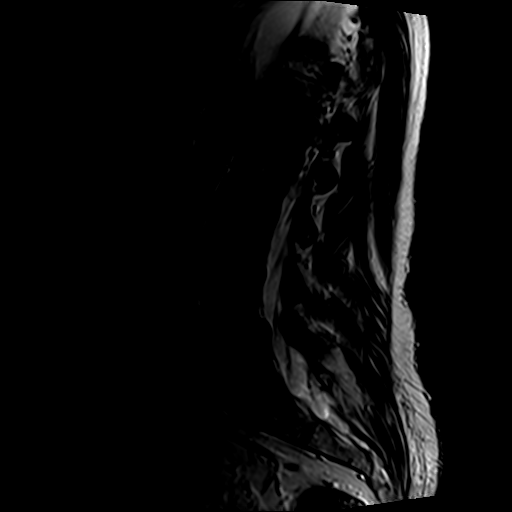
[im 10/16]
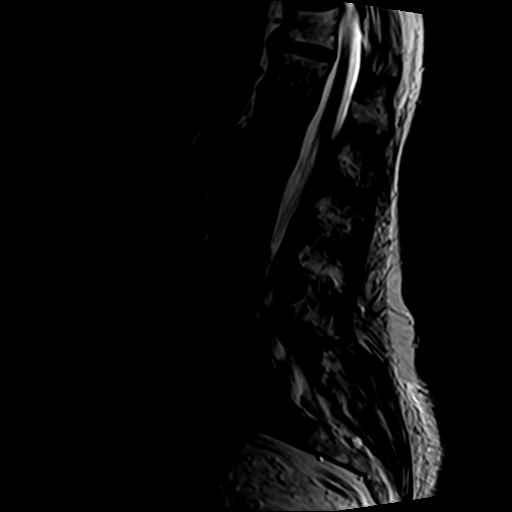
[im 13/16]
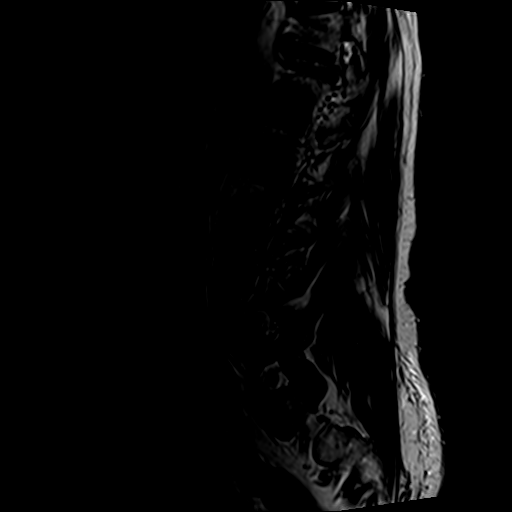
[im 16/16]
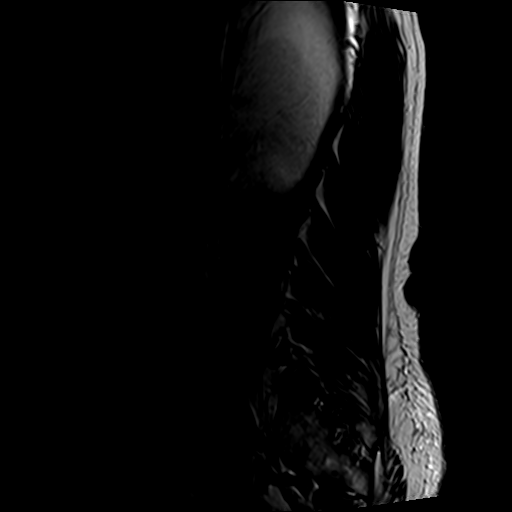

[Series 4: T1 · sagittal · 4.0mm · 0.53mm/px · 7 of 16 slices shown (1 of 2)]
[im 1/16]
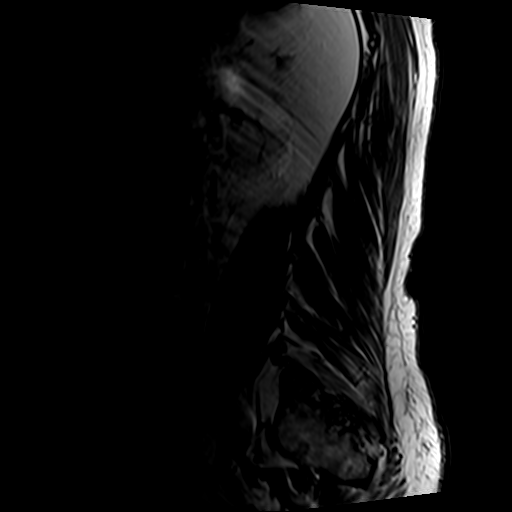
[im 3/16]
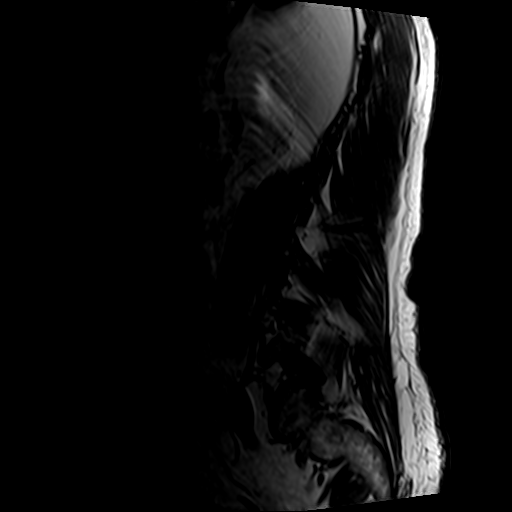
[im 6/16]
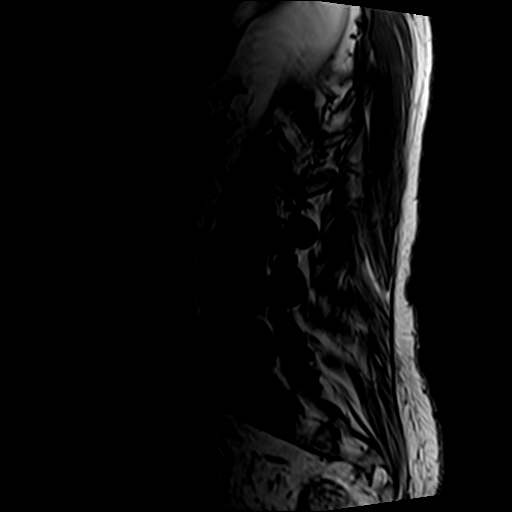
[im 8/16]
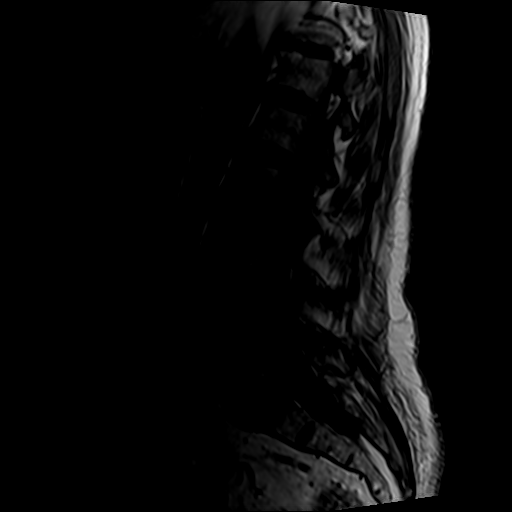
[im 11/16]
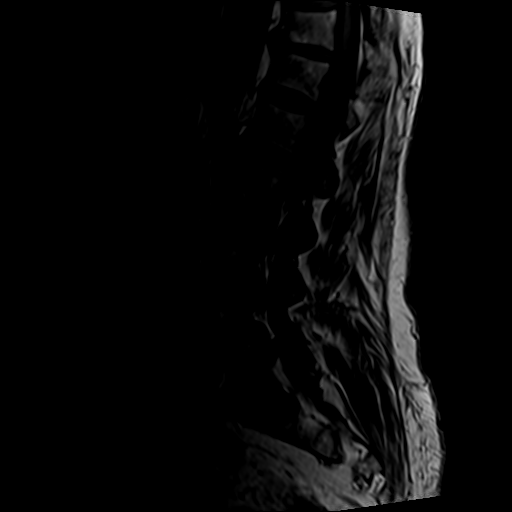
[im 13/16]
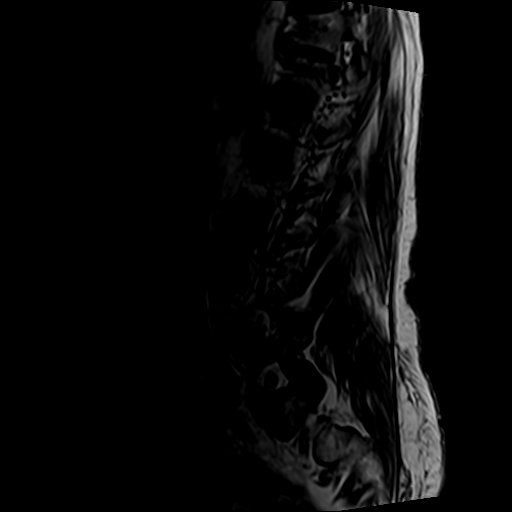
[im 16/16]
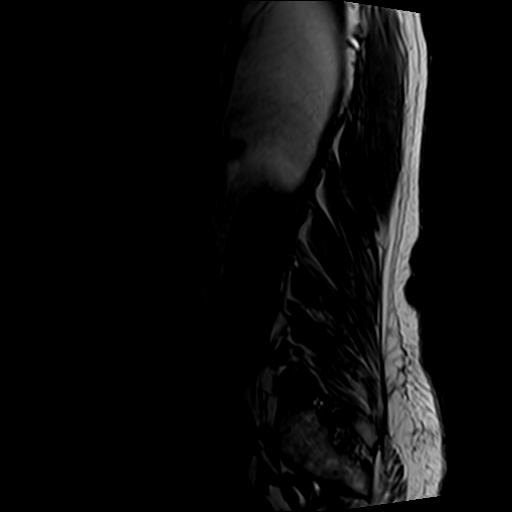

[Series 5: T2 · axial · 4.0mm · 0.70mm/px · z∈[-1,+206]mm · 8 of 35 slices shown (2 of 2)]
[im 1/35]
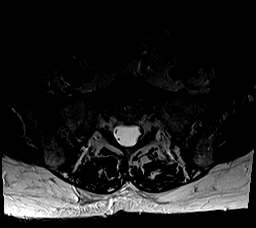
[im 6/35]
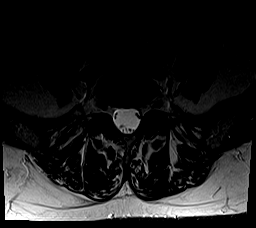
[im 11/35]
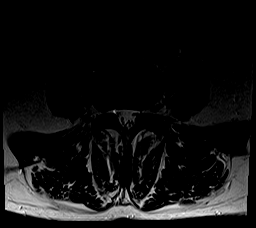
[im 16/35]
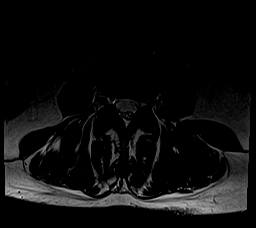
[im 19/35]
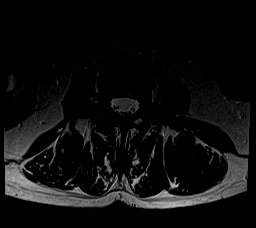
[im 24/35]
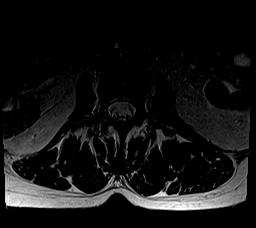
[im 29/35]
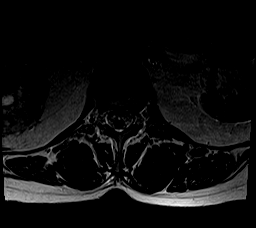
[im 35/35]
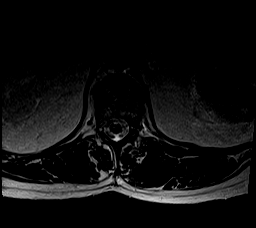

[Series 6: T1 · axial · 4.0mm · 0.35mm/px · z∈[-1,+176]mm · 4 of 35 slices shown (2 of 2)]
[im 1/35]
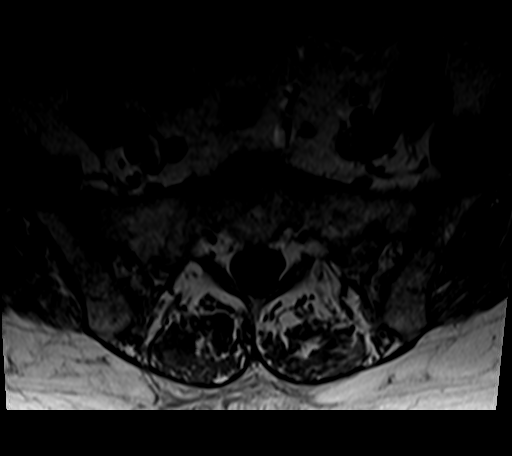
[im 6/35]
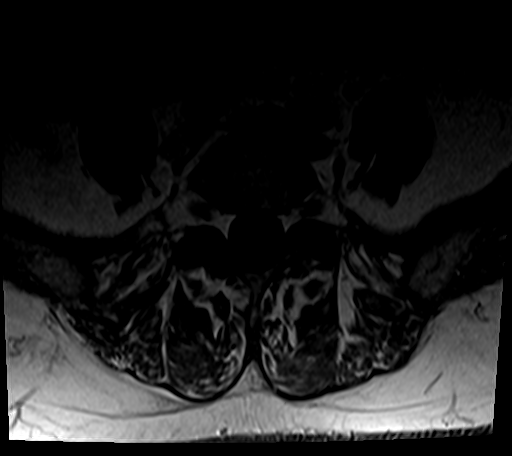
[im 19/35]
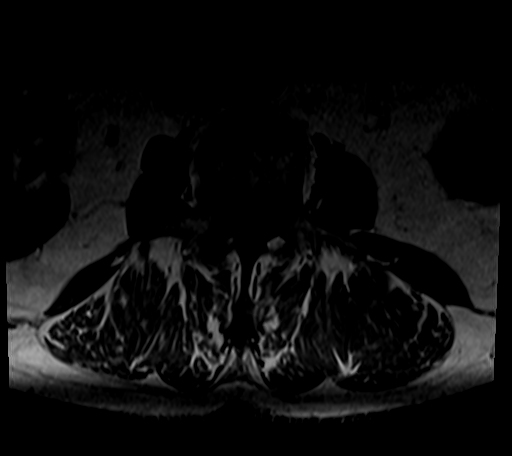
[im 29/35]
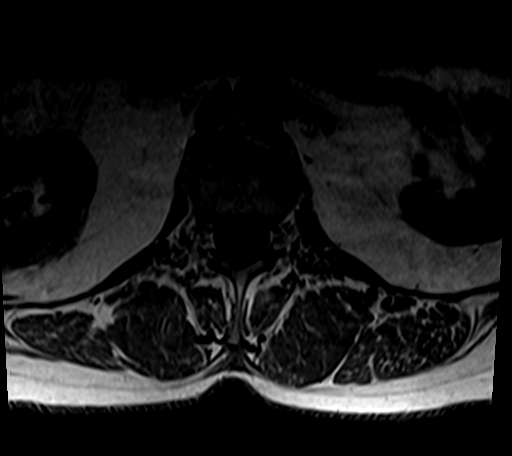

[25 of 48 positions shown; findings below may reference images not displayed]

FINDINGS: Segmentation: 5 non rib-bearing lumbar type vertebral bodies are
present. The lowest fully formed vertebral body is L5.

Alignment: Grade 1 anterolisthesis at L4-5 measures 4 mm. No other
significant listhesis is present. Levoconvex curvature is centered
at L4.

Vertebrae: Chronic fatty endplate marrow changes are noted
anteriorly at T11-12. Marrow signal and vertebral body heights are
otherwise normal.

Conus medullaris and cauda equina: Conus extends to the L2 level.
Conus and cauda equina appear normal.

Paraspinal and other soft tissues: Limited imaging the abdomen is
unremarkable. There is no significant adenopathy. No solid organ
lesions are present.

Disc levels:

T12-L1: Negative.

L1-2: Negative.

L2-3: Negative.

L3-4: A leftward disc bulge is present. Mild facet hypertrophy is
noted bilaterally. No significant stenosis is present.

There is uncovering of a broad-based disc protrusion. Moderate facet
hypertrophy is noted bilaterally. Central canal is patent. Mild
foraminal narrowing is evident bilaterally.

L5-S1: Mild facet hypertrophy is worse on the left. No significant
stenosis is present.
IMPRESSION: 1. Mild foraminal narrowing bilaterally at L3-4 secondary to a
leftward disc bulge and moderate facet hypertrophy.
2. Grade 1 anterolisthesis at L4-5 measures 4 mm.
3. Mild facet hypertrophy at L5-S1 is worse on the left. There is no
significant stenosis.
